# Patient Record
Sex: Male | Born: 1960
Health system: Southern US, Community
[De-identification: ages and names within clinical notes are randomized; demographics above are authoritative.]

## PROBLEM LIST (undated history)

## (undated) DIAGNOSIS — J4550 Severe persistent asthma, uncomplicated: Secondary | ICD-10-CM

## (undated) DIAGNOSIS — U071 COVID-19: Secondary | ICD-10-CM

## (undated) DIAGNOSIS — R053 Chronic cough: Secondary | ICD-10-CM

## (undated) DIAGNOSIS — J45909 Unspecified asthma, uncomplicated: Secondary | ICD-10-CM

## (undated) DIAGNOSIS — F17200 Nicotine dependence, unspecified, uncomplicated: Secondary | ICD-10-CM

## (undated) HISTORY — DX: COVID-19: U07.1

## (undated) HISTORY — DX: Nicotine dependence, unspecified, uncomplicated: F17.200

---

## 2017-10-25 ENCOUNTER — Ambulatory Visit
Admission: EM | Admit: 2017-10-25 | Discharge: 2017-10-25 | Disposition: A | Discharge status: Home / Self Care | Payer: 59 | Attending: Internal Medicine | Admitting: Internal Medicine

## 2017-10-25 DIAGNOSIS — M545 Low back pain, unspecified: Secondary | ICD-10-CM

## 2017-10-25 DIAGNOSIS — N3090 Cystitis, unspecified without hematuria: Secondary | ICD-10-CM | POA: Diagnosis not present

## 2017-10-25 LAB — URINALYSIS, COMPLETE (UACMP) WITH MICROSCOPIC
Bacteria, UA: NONE SEEN
Bilirubin Urine: NEGATIVE
Glucose, UA: NEGATIVE mg/dL
Ketones, ur: NEGATIVE mg/dL
Nitrite: NEGATIVE
Specific Gravity, Urine: 1.015 (ref 1.005–1.030)
Squamous Epithelial / LPF: NONE SEEN (ref 0–5)
pH: 7 (ref 5.0–8.0)

## 2017-10-25 LAB — CBC WITH DIFFERENTIAL/PLATELET
Basophils Absolute: 0 10*3/uL (ref 0–0.1)
Basophils Relative: 1 %
Eosinophils Absolute: 0.2 10*3/uL (ref 0–0.7)
Eosinophils Relative: 3 %
HCT: 43.5 % (ref 40.0–52.0)
HEMOGLOBIN: 15 g/dL (ref 13.0–18.0)
LYMPHS ABS: 1.4 10*3/uL (ref 1.0–3.6)
LYMPHS PCT: 19 %
MCH: 30.3 pg (ref 26.0–34.0)
MCHC: 34.5 g/dL (ref 32.0–36.0)
MCV: 88 fL (ref 80.0–100.0)
Monocytes Absolute: 0.8 10*3/uL (ref 0.2–1.0)
Monocytes Relative: 11 %
NEUTROS PCT: 68 %
Neutro Abs: 5.2 10*3/uL (ref 1.4–6.5)
Platelets: 242 10*3/uL (ref 150–440)
RBC: 4.94 MIL/uL (ref 4.40–5.90)
RDW: 13.4 % (ref 11.5–14.5)
WBC: 7.7 10*3/uL (ref 3.8–10.6)

## 2017-10-25 MED ORDER — METHOCARBAMOL 500 MG PO TABS: 500.0000 mg | ORAL_TABLET | Freq: Two times a day (BID) | ORAL | 0 refills | Status: DC

## 2017-10-25 MED ORDER — NITROFURANTOIN MONOHYD MACRO 100 MG PO CAPS: 100.0000 mg | ORAL_CAPSULE | Freq: Two times a day (BID) | ORAL | 0 refills | Status: DC

## 2017-10-25 NOTE — ED Provider Notes (Signed)
[facility]    CSN: [id number] Arrival date & time: 10/25/17  1240     History   Chief Complaint Chief Complaint  Patient presents with  . Back Pain    HPI Philip [name] is a 57 y.o. male who presents today for evaluation of right-sided low back pain ongoing since Thursday.  Patient denies any trauma or injury affecting his lower back.  The pain does not occur constantly, he is unsure of what activities precipitate the pain, the only thing that he found that helps with the pain is laying still.  He reports that sharp pain which radiates from the right lumbar region around his right flank and into his groin, denies any fevers or chills at home.  He does report associated nausea and vomiting.  He has not noticed any changes in the appearance or smell of his urine, he does report changes in the frequency, recently he states that he has had a decreased frequency.  No surgical history to the abdomen or lower back.  He is currently not taking any medications for relief.  HPI  History reviewed. No pertinent past medical history.  There are no active problems to display for this patient.   History reviewed. No pertinent surgical history.     Home Medications    Prior to Admission medications   Medication Sig Start Date End Date Taking? Authorizing Provider  methocarbamol (ROBAXIN) 500 MG tablet Take 1 tablet (500 mg total) by mouth 2 (two) times daily. 10/25/17   Anson Oregon, PA-C  nitrofurantoin, macrocrystal-monohydrate, (MACROBID) 100 MG capsule Take 1 capsule (100 mg total) by mouth 2 (two) times daily. 10/25/17   Anson Oregon, PA-C    Family History No family history on file.  Social History Social History   Tobacco Use  . Smoking status: Current Every Day Smoker  . Smokeless tobacco: Never Used  Substance Use Topics  . Alcohol use: Not on file  . Drug use: Not on file     Allergies   Patient has no known allergies.   Review of  Systems Review of Systems  Gastrointestinal: Positive for abdominal pain, nausea and vomiting.  Genitourinary: Positive for decreased urine volume and flank pain. Negative for discharge, frequency, hematuria, testicular pain and urgency.  All other systems reviewed and are negative.  Physical Exam Triage Vital Signs ED Triage Vitals  Enc Vitals Group     BP 10/25/17 1250 (!) 151/95     Pulse Rate 10/25/17 1250 86     Resp 10/25/17 1250 18     Temp 10/25/17 1250 98.7 F (37.1 C)     Temp Source 10/25/17 1250 Oral     SpO2 10/25/17 1250 99 %     Weight --      Height --      Head Circumference --      Peak Flow --      Pain Score 10/25/17 1252 3     Pain Loc --      Pain Edu? --      Excl. in GC? --    No data found.  Updated Vital Signs BP (!) 151/95 (BP Location: Left Arm)   Pulse 86   Temp 98.7 F (37.1 C) (Oral)   Resp 18   SpO2 99%   Visual Acuity Right Eye Distance:   Left Eye Distance:   Bilateral Distance:    Right Eye Near:   Left Eye Near:    Bilateral Near:  Physical Exam  Constitutional: He appears well-developed and well-nourished. He is active.  Cardiovascular: Normal rate, regular rhythm, S1 normal, S2 normal, normal heart sounds and normal pulses.  Pulmonary/Chest: Effort normal and breath sounds normal. No stridor. He has no wheezes. He has no rhonchi. He has no rales.  Abdominal: Soft. Normal appearance and bowel sounds are normal. He exhibits no distension, no pulsatile liver, no abdominal bruit and no pulsatile midline mass. There is no tenderness. There is CVA tenderness. There is no tenderness at McBurney's point and negative Murphy's sign. No hernia.  Neurological: He is alert.   UC Treatments / Results  Labs (all labs ordered are listed, but only abnormal results are displayed) Labs Reviewed  URINALYSIS, COMPLETE (UACMP) WITH MICROSCOPIC - Abnormal; Notable for the following components:      Result Value   Hgb urine dipstick LARGE (*)     Protein, ur TRACE (*)    Leukocytes, UA TRACE (*)    All other components within normal limits  CBC WITH DIFFERENTIAL/PLATELET    EKG None  Radiology No results found.  Procedures Procedures (including critical care time)  Medications Ordered in UC Medications - No data to display  Initial Impression / Assessment and Plan / UC Course  I have reviewed the triage vital signs and the nursing notes.  Pertinent labs & imaging results that were available during my care of the patient were reviewed by me and considered in my medical decision making (see chart for details).     1.  Treatment options were discussed today with the patient. 2.  UA borderline for UTI, no increased in WBC on CBC. 3.  Will treat patient for presumed UTI, also provided muscle relaxer as his pain may be muscular in nature. 4.  Increase water intake over next several days. 5.  Follow-up or contact clinic if symptoms worsen or fail to improve. Final Clinical Impressions(s) / UC Diagnoses   Final diagnoses:  Acute right-sided low back pain without sciatica  Cystitis     Discharge Instructions     -Take medications as directed. -Increase water intake. -If symptoms don't improve with medication contact PCP or [facility].   ED Prescriptions    Medication Sig Dispense Auth. Provider   methocarbamol (ROBAXIN) 500 MG tablet Take 1 tablet (500 mg total) by mouth 2 (two) times daily. 20 tablet Anson OregonMcGhee, James Lance, PA-C   nitrofurantoin, macrocrystal-monohydrate, (MACROBID) 100 MG capsule Take 1 capsule (100 mg total) by mouth 2 (two) times daily. 14 capsule Anson OregonMcGhee, James Lance, PA-C     Controlled Substance Prescriptions [place] Controlled Substance Registry consulted? Not Applicable   Anson OregonMcGhee, James Lance, PA-C 10/25/17 1440

## 2017-10-25 NOTE — ED Triage Notes (Signed)
Pt states since thur. He has been having low back pain on his right side radiating to his abdomen and groin area. Have been taking tylenol that helped ease the pain. Does make him feel nauseous with vomiting. No urinary symptoms reported.

## 2017-10-25 NOTE — Discharge Instructions (Signed)
-  Take medications as directed. -Increase water intake. -If symptoms don't improve with medication contact PCP or [facility].

## 2017-10-29 ENCOUNTER — Encounter: Payer: Self-pay | Admitting: Family Medicine

## 2017-10-29 ENCOUNTER — Ambulatory Visit (INDEPENDENT_AMBULATORY_CARE_PROVIDER_SITE_OTHER): Payer: Self-pay | Admitting: Family Medicine

## 2017-10-29 VITALS — BP 124/82 | HR 104 | Temp 98.4°F | Resp 18 | Ht 67.0 in | Wt 177.0 lb

## 2017-10-29 DIAGNOSIS — Z0000 Encounter for general adult medical examination without abnormal findings: Secondary | ICD-10-CM

## 2017-10-29 LAB — POCT URINALYSIS DIP (MANUAL ENTRY)
Bilirubin, UA: NEGATIVE
GLUCOSE UA: NEGATIVE mg/dL
Ketones, POC UA: NEGATIVE mg/dL
Leukocytes, UA: NEGATIVE
NITRITE UA: NEGATIVE
PROTEIN UA: NEGATIVE mg/dL
RBC UA: NEGATIVE
SPEC GRAV UA: 1.02 (ref 1.010–1.025)
UROBILINOGEN UA: 1 U/dL
pH, UA: 6 (ref 5.0–8.0)

## 2017-10-29 NOTE — Patient Instructions (Addendum)
Select a PCP from the list provided to establish care and obtain recommended health screenings. Continue exercise and other activities of healthy living. Great job with quitting smoking!    Health Maintenance, Male A healthy lifestyle and preventive care is important for your health and wellness. Ask your health care provider about what schedule of regular examinations is right for you. What should I know about weight and diet? Eat a Healthy Diet  Eat plenty of vegetables, fruits, whole grains, low-fat dairy products, and lean protein.  Do not eat a lot of foods high in solid fats, added sugars, or salt.  Maintain a Healthy Weight Regular exercise can help you achieve or maintain a healthy weight. You should:  Do at least 150 minutes of exercise each week. The exercise should increase your heart rate and make you sweat (moderate-intensity exercise).  Do strength-training exercises at least twice a week.  Watch Your Levels of Cholesterol and Blood Lipids  Have your blood tested for lipids and cholesterol every 5 years starting at 57 years of age. If you are at high risk for heart disease, you should start having your blood tested when you are 57 years old. You may need to have your cholesterol levels checked more often if: ? Your lipid or cholesterol levels are high. ? You are older than 57 years of age. ? You are at high risk for heart disease.  What should I know about cancer screening? Many types of cancers can be detected early and may often be prevented. Lung Cancer  You should be screened every year for lung cancer if: ? You are a current smoker who has smoked for at least 30 years. ? You are a former smoker who has quit within the past 15 years.  Talk to your health care provider about your screening options, when you should start screening, and how often you should be screened.  Colorectal Cancer  Routine colorectal cancer screening usually begins at 57 years of age  and should be repeated every 5-10 years until you are 57 years old. You may need to be screened more often if early forms of precancerous polyps or small growths are found. Your health care provider may recommend screening at an earlier age if you have risk factors for colon cancer.  Your health care provider may recommend using home test kits to check for hidden blood in the stool.  A small camera at the end of a tube can be used to examine your colon (sigmoidoscopy or colonoscopy). This checks for the earliest forms of colorectal cancer.  Prostate and Testicular Cancer  Depending on your age and overall health, your health care provider may do certain tests to screen for prostate and testicular cancer.  Talk to your health care provider about any symptoms or concerns you have about testicular or prostate cancer.  Skin Cancer  Check your skin from head to toe regularly.  Tell your health care provider about any new moles or changes in moles, especially if: ? There is a change in a mole's size, shape, or color. ? You have a mole that is larger than a pencil eraser.  Always use sunscreen. Apply sunscreen liberally and repeat throughout the day.  Protect yourself by wearing long sleeves, pants, a wide-brimmed hat, and sunglasses when outside.  What should I know about heart disease, diabetes, and high blood pressure?  If you are 82-29 years of age, have your blood pressure checked every 3-5 years. If you are 40  years of age or older, have your blood pressure checked every year. You should have your blood pressure measured twice-once when you are at a hospital or clinic, and once when you are not at a hospital or clinic. Record the average of the two measurements. To check your blood pressure when you are not at a hospital or clinic, you can use: ? An automated blood pressure machine at a pharmacy. ? A home blood pressure monitor.  Talk to your health care provider about your target blood  pressure.  If you are between 8545-57 years old, ask your health care provider if you should take aspirin to prevent heart disease.  Have regular diabetes screenings by checking your fasting blood sugar level. ? If you are at a normal weight and have a low risk for diabetes, have this test once every three years after the age of 57. ? If you are overweight and have a high risk for diabetes, consider being tested at a younger age or more often.  A one-time screening for abdominal aortic aneurysm (AAA) by ultrasound is recommended for men aged 65-75 years who are current or former smokers. What should I know about preventing infection? Hepatitis B If you have a higher risk for hepatitis B, you should be screened for this virus. Talk with your health care provider to find out if you are at risk for hepatitis B infection. Hepatitis C Blood testing is recommended for:  Everyone born from [birth year] through 1965.  Anyone with known risk factors for hepatitis C.  Sexually Transmitted Diseases (STDs)  You should be screened each year for STDs including gonorrhea and chlamydia if: ? You are sexually active and are younger than 57 years of age. ? You are older than 57 years of age and your health care provider tells you that you are at risk for this type of infection. ? Your sexual activity has changed since you were last screened and you are at an increased risk for chlamydia or gonorrhea. Ask your health care provider if you are at risk.  Talk with your health care provider about whether you are at high risk of being infected with HIV. Your health care provider may recommend a prescription medicine to help prevent HIV infection.  What else can I do?  Schedule regular health, dental, and eye exams.  Stay current with your vaccines (immunizations).  Do not use any tobacco products, such as cigarettes, chewing tobacco, and e-cigarettes. If you need help quitting, ask your health care  provider.  Limit alcohol intake to no more than 2 drinks per day. One drink equals 12 ounces of beer, 5 ounces of wine, or 1 ounces of hard liquor.  Do not use street drugs.  Do not share needles.  Ask your health care provider for help if you need support or information about quitting drugs.  Tell your health care provider if you often feel depressed.  Tell your health care provider if you have ever been abused or do not feel safe at home. This information is not intended to replace advice given to you by your health care provider. Make sure you discuss any questions you have with your health care provider. Document Released: 10/25/2007 Document Revised: 12/26/2015 Document Reviewed: 01/30/2015 Elsevier Interactive Patient Education  2018 [employer]     Coping with Quitting Smoking Quitting smoking is a physical and mental challenge. You will face cravings, withdrawal symptoms, and temptation. Before quitting, work with your health care provider to make  a plan that can help you cope. Preparation can help you quit and keep you from giving in. How can I cope with cravings? Cravings usually last for 5-10 minutes. If you get through it, the craving will pass. Consider taking the following actions to help you cope with cravings: Keep your mouth busy: Chew sugar-free gum. Suck on hard candies or a straw. Brush your teeth. Keep your hands and body busy: Immediately change to a different activity when you feel a craving. Squeeze or play with a ball. Do an activity or a hobby, like making bead jewelry, practicing needlepoint, or working with wood. Mix up your normal routine. Take a short exercise break. Go for a quick walk or run up and down stairs. Spend time in public places where smoking is not allowed. Focus on doing something kind or helpful for someone else. Call a friend or family member to talk during a craving. Join a support group. Call a quit line, such as  1-800-QUIT-NOW. Talk with your health care provider about medicines that might help you cope with cravings and make quitting easier for you.  How can I deal with withdrawal symptoms? Your body may experience negative effects as it tries to get used to not having nicotine in the system. These effects are called withdrawal symptoms. They may include: Feeling hungrier than normal. Trouble concentrating. Irritability. Trouble sleeping. Feeling depressed. Restlessness and agitation. Craving a cigarette.  To manage withdrawal symptoms: Avoid places, people, and activities that trigger your cravings. Remember why you want to quit. Get plenty of sleep. Avoid coffee and other caffeinated drinks. These may worsen some of your symptoms.  How can I handle social situations? Social situations can be difficult when you are quitting smoking, especially in the first few weeks. To manage this, you can: Avoid parties, bars, and other social situations where people might be smoking. Avoid alcohol. Leave right away if you have the urge to smoke. Explain to your family and friends that you are quitting smoking. Ask for understanding and support. Plan activities with friends or family where smoking is not an option.  What are some ways I can cope with stress? Wanting to smoke may cause stress, and stress can make you want to smoke. Find ways to manage your stress. Relaxation techniques can help. For example: Breathe slowly and deeply, in through your nose and out through your mouth. Listen to soothing, relaxing music. Talk with a family member or friend about your stress. Light a candle. Soak in a bath or take a shower. Think about a peaceful place.  What are some ways I can prevent weight gain? Be aware that many people gain weight after they quit smoking. However, not everyone does. To keep from gaining weight, have a plan in place before you quit and stick to the plan after you quit. Your plan  should include: Having healthy snacks. When you have a craving, it may help to: Eat plain popcorn, crunchy carrots, celery, or other cut vegetables. Chew sugar-free gum. Changing how you eat: Eat small portion sizes at meals. Eat 4-6 small meals throughout the day instead of 1-2 large meals a day. Be mindful when you eat. Do not watch television or do other things that might distract you as you eat. Exercising regularly: Make time to exercise each day. If you do not have time for a long workout, do short bouts of exercise for 5-10 minutes several times a day. Do some form of strengthening exercise, like weight lifting,  and some form of aerobic exercise, like running or swimming. Drinking plenty of water or other low-calorie or no-calorie drinks. Drink 6-8 glasses of water daily, or as much as instructed by your health care provider.  Summary Quitting smoking is a physical and mental challenge. You will face cravings, withdrawal symptoms, and temptation to smoke again. Preparation can help you as you go through these challenges. You can cope with cravings by keeping your mouth busy (such as by chewing gum), keeping your body and hands busy, and making calls to family, friends, or a helpline for people who want to quit smoking. You can cope with withdrawal symptoms by avoiding places where people smoke, avoiding drinks with caffeine, and getting plenty of rest. Ask your health care provider about the different ways to prevent weight gain, avoid stress, and handle social situations. This information is not intended to replace advice given to you by your health care provider. Make sure you discuss any questions you have with your health care provider. Document Released: 04/25/2016 Document Revised: 04/25/2016 Document Reviewed: 04/25/2016 Elsevier Interactive Patient Education  [employer].

## 2017-10-29 NOTE — Progress Notes (Signed)
Subjective:  Philip [name] is a 57 y.o. male who presents for wellness exam in order to satisfy employer sponsored health benefits requirements. He is a current daily smoker, stopped about 4 days ago and stopped soda. Sea salt. Over the counter inhaler use in the past. In his mid-20's he experienced routine wheezing and shortness of breath without a formal diagnosis of asthma or COPD. These symptoms resolved is his 20's and he denies any current symptoms of SOB, exertional dyspnea, chest pain, dizziness, or headaches. He endorses routine physical exercise which includes walking 1-2 times per week, intervals of 30-40 minutes.   Overdue Health Maintenance   He is currently overdue for routine screening labs, prostate evaluation with PSA testing, baseline EKG, and Hep C screening.  Denies known family history of cardiovascular disease, cancer, chronic respiratory disease, or diabetes.   Patient denies any current health related concerns.   Patient reports only a surgery to remove Ganglion Cyst from left wrist.  Social History   Tobacco Use  . Smoking status: Current Every Day Smoker  . Smokeless tobacco: Never Used  Substance Use Topics  . Alcohol use: Not on file  . Drug use: Not on file    No Known Allergies  Current Outpatient Medications  Medication Sig Dispense Refill  . methocarbamol (ROBAXIN) 500 MG tablet Take 1 tablet (500 mg total) by mouth 2 (two) times daily. 20 tablet 0  . nitrofurantoin, macrocrystal-monohydrate, (MACROBID) 100 MG capsule Take 1 capsule (100 mg total) by mouth 2 (two) times daily. 14 capsule 0   No current facility-administered medications for this visit.     Review of Systems  Constitutional: Negative.   HENT: Negative.   Eyes: Negative.   Respiratory: Negative.   Cardiovascular: Negative.   Gastrointestinal: Negative.   Genitourinary: Negative.   Musculoskeletal: Positive for back pain.       Recently tx for sciatica   Skin: Negative.    Neurological: Negative.   Endo/Heme/Allergies: Negative.   Psychiatric/Behavioral: Negative for depression, hallucinations, substance abuse and suicidal ideas. The patient is not nervous/anxious and does not have insomnia.    Objective:  Blood pressure 124/82, pulse (!) 104, temperature 98.4 F (36.9 C), temperature source Oral, resp. rate 18, height 5\' 7"  (1.702 m), weight 177 lb (80.3 kg), SpO2 97 %.  Constitutional: Patient appears well-developed and well-nourished. No distress. HENT: Normocephalic, atraumatic, External right and left ear normal. Oropharynx is clear and moist (upper and lower dentures).  Eyes: Conjunctivae and EOM are normal. PERRLA, no scleral icterus. Neck: Normal ROM. Neck supple. No JVD. No tracheal deviation. No thyromegaly. CVS: RRR, S1/S2 +, no murmurs, no gallops, no carotid bruit.  Pulmonary: Effort and breath sounds normal, no stridor, rhonchi, wheezes, rales.  Abdominal: Soft. BS +, no distension, tenderness, rebound or guarding.  Musculoskeletal: Normal range of motion. No edema and no tenderness.  Lymphadenopathy: No lymphadenopathy noted, cervical Neuro: Alert. Normal reflexes, muscle tone coordination. No cranial nerve deficit. Skin: Skin is warm and dry. No rash noted. Not diaphoretic. No erythema. No pallor. Psychiatric: Normal mood and affect. Behavior, judgment, thought content normal.    Hearing Screening   125Hz  250Hz  500Hz  1000Hz  2000Hz  3000Hz  4000Hz  6000Hz  8000Hz   Right ear:   Pass Pass Pass  Pass    Left ear:   Pass Pass Pass  Pass      Visual Acuity Screening   Right eye Left eye Both eyes  Without correction: 20/13 20/13 20/10   With correction:  Assessment and Plan:   1. Wellness examination Discussed overdue health maintenance and the importance of establishing with a PCP. Patient verbalized understanding. Continue routine physical activity. Continue low-sodium diet.  UA negative of any abnormal findings today. Patient education  provided.   Patient will follow up with PCP to have a complete   Godfrey PickKimberly S. Tiburcio PeaHarris, MSN, FNP-C [facility]  [address]  [place], [place] [id number] [phone]

## 2018-02-24 ENCOUNTER — Ambulatory Visit: Payer: 59 | Admitting: Family Medicine

## 2018-02-24 ENCOUNTER — Encounter: Payer: Self-pay | Admitting: Family Medicine

## 2018-02-24 VITALS — BP 122/76 | HR 87 | Temp 98.5°F | Ht 67.5 in | Wt 178.6 lb

## 2018-02-24 DIAGNOSIS — Z1159 Encounter for screening for other viral diseases: Secondary | ICD-10-CM | POA: Diagnosis not present

## 2018-02-24 DIAGNOSIS — R109 Unspecified abdominal pain: Secondary | ICD-10-CM

## 2018-02-24 DIAGNOSIS — Z114 Encounter for screening for human immunodeficiency virus [HIV]: Secondary | ICD-10-CM | POA: Diagnosis not present

## 2018-02-24 DIAGNOSIS — Z0000 Encounter for general adult medical examination without abnormal findings: Secondary | ICD-10-CM

## 2018-02-24 DIAGNOSIS — Z0001 Encounter for general adult medical examination with abnormal findings: Secondary | ICD-10-CM

## 2018-02-24 DIAGNOSIS — Z716 Tobacco abuse counseling: Secondary | ICD-10-CM

## 2018-02-24 DIAGNOSIS — F17210 Nicotine dependence, cigarettes, uncomplicated: Secondary | ICD-10-CM

## 2018-02-24 DIAGNOSIS — Z125 Encounter for screening for malignant neoplasm of prostate: Secondary | ICD-10-CM

## 2018-02-24 LAB — POCT URINALYSIS DIPSTICK
BILIRUBIN UA: NEGATIVE
Blood, UA: NEGATIVE
GLUCOSE UA: NEGATIVE
KETONES UA: NEGATIVE
Leukocytes, UA: NEGATIVE
Nitrite, UA: NEGATIVE
Protein, UA: NEGATIVE
Spec Grav, UA: 1.025 (ref 1.010–1.025)
Urobilinogen, UA: 1 E.U./dL
pH, UA: 6 (ref 5.0–8.0)

## 2018-02-24 LAB — CBC WITH DIFFERENTIAL/PLATELET
BASOS PCT: 1.2 % (ref 0.0–3.0)
Basophils Absolute: 0 10*3/uL (ref 0.0–0.1)
EOS PCT: 0.8 % (ref 0.0–5.0)
Eosinophils Absolute: 0 10*3/uL (ref 0.0–0.7)
HCT: 43.7 % (ref 39.0–52.0)
HEMOGLOBIN: 14.6 g/dL (ref 13.0–17.0)
Lymphocytes Relative: 38.1 % (ref 12.0–46.0)
Lymphs Abs: 1.4 10*3/uL (ref 0.7–4.0)
MCHC: 33.4 g/dL (ref 30.0–36.0)
MCV: 90.7 fl (ref 78.0–100.0)
Monocytes Absolute: 0.5 10*3/uL (ref 0.1–1.0)
Monocytes Relative: 15.1 % — ABNORMAL HIGH (ref 3.0–12.0)
Neutro Abs: 1.6 10*3/uL (ref 1.4–7.7)
Neutrophils Relative %: 44.8 % (ref 43.0–77.0)
Platelets: 227 10*3/uL (ref 150.0–400.0)
RBC: 4.82 Mil/uL (ref 4.22–5.81)
RDW: 14 % (ref 11.5–15.5)
WBC: 3.6 10*3/uL — AB (ref 4.0–10.5)

## 2018-02-24 LAB — LIPID PANEL
Cholesterol: 186 mg/dL (ref 0–200)
HDL: 50.9 mg/dL (ref >39.00)
LDL Cholesterol: 110 mg/dL — ABNORMAL HIGH (ref 0–99)
NONHDL: 134.6
Total CHOL/HDL Ratio: 4
Triglycerides: 123 mg/dL (ref 0.0–149.0)
VLDL: 24.6 mg/dL (ref 0.0–40.0)

## 2018-02-24 LAB — HEPATIC FUNCTION PANEL
ALT: 25 U/L (ref 0–53)
AST: 16 U/L (ref 0–37)
Albumin: 4.2 g/dL (ref 3.5–5.2)
Alkaline Phosphatase: 67 U/L (ref 39–117)
Bilirubin, Direct: 0.1 mg/dL (ref 0.0–0.3)
Total Bilirubin: 0.4 mg/dL (ref 0.2–1.2)
Total Protein: 7.1 g/dL (ref 6.0–8.3)

## 2018-02-24 LAB — BASIC METABOLIC PANEL
BUN: 13 mg/dL (ref 6–23)
CHLORIDE: 103 meq/L (ref 96–112)
CO2: 33 mEq/L — ABNORMAL HIGH (ref 19–32)
Calcium: 9.9 mg/dL (ref 8.4–10.5)
Creatinine, Ser: 0.95 mg/dL (ref 0.40–1.50)
GFR: 104.9 mL/min (ref >60.00)
GLUCOSE: 87 mg/dL (ref 70–99)
POTASSIUM: 3.9 meq/L (ref 3.5–5.1)
Sodium: 141 mEq/L (ref 135–145)

## 2018-02-24 LAB — TSH: TSH: 1.13 u[IU]/mL (ref 0.35–4.50)

## 2018-02-24 NOTE — Patient Instructions (Signed)
Great to meet you!  Let me know if you would like to do colonoscopy!

## 2018-02-24 NOTE — Progress Notes (Signed)
Subjective:    Patient ID: Philip [name], male    DOB: 12/07/1960, 57 y.o.   MRN: [id number]  HPI  Presents to clinic to establish care with PCP.  He currently takes no medications and has no active medical issues.  He is a cigarette smoker, but smokes 3 to 4 cigarettes a day.  He had quit in past for a period of 3 to 4 months, but recently started smoking small amount again.  Patient does not know much about his family history other than his mother died at a young age, he does not know reason for her death.  Patient has never had a surgery.  Patient works for [employer] in environmental services.   Patient's Tdap vaccine and influenza vaccine up-to-date.  Patient has never had a colonoscopy.  Patient sees dentist 1-2 times per year.  Unsure of last eye exam.  Patient tries to eat a healthy diet with various proteins, vegetables and fruits.  Tries to workout regularly, enjoys using the treadmill and elliptical.  Patient also complains of some right-sided flank pain off and on for the past week.  States he has no pain now, states it comes and goes at random times.  Denies any issues with urination or having bowel movements.  Appetite has been normal.  Denies any nausea, vomiting or diarrhea.  Past Medical History:  Diagnosis Date  . Smoker    Social History   Tobacco Use  . Smoking status: Current Every Day Smoker  . Smokeless tobacco: Never Used  Substance Use Topics  . Alcohol use: Never    Frequency: Never   Family History  Problem Relation Age of Onset  . Early death Mother    History reviewed. No pertinent surgical history.  Review of Systems  Constitutional: Negative for chills, fatigue and fever.  HENT: Negative for congestion, ear pain, sinus pain and sore throat.   Eyes: Negative.   Respiratory: Negative for cough, shortness of breath and wheezing.   Cardiovascular: Negative for chest pain, palpitations and leg swelling.  Gastrointestinal: Negative for  abdominal pain, diarrhea, nausea and vomiting. +right flank pain.  Genitourinary: Negative for dysuria, frequency and urgency.  Musculoskeletal: Negative for arthralgias and myalgias.  Skin: Negative for color change, pallor and rash.  Neurological: Negative for syncope, light-headedness and headaches.  Psychiatric/Behavioral: The patient is not nervous/anxious.       Objective:   Physical Exam  Constitutional: He is oriented to person, place, and time. He appears well-developed and well-nourished. No distress.  HENT:  Head: Normocephalic and atraumatic.  Right Ear: External ear normal.  Left Ear: External ear normal.  Nose: Nose normal.  Mouth/Throat: Oropharynx is clear and moist. No oropharyngeal exudate.  Eyes: Pupils are equal, round, and reactive to light. Conjunctivae and EOM are normal. Right eye exhibits no discharge. Left eye exhibits no discharge. No scleral icterus.  Neck: Normal range of motion. Neck supple. No JVD present. No tracheal deviation present. No thyromegaly present.  Cardiovascular: Normal rate, regular rhythm and normal heart sounds.  Pulmonary/Chest: Effort normal and breath sounds normal. No respiratory distress. He has no wheezes. He has no rales.  Abdominal: Soft. Bowel sounds are normal. He exhibits no distension and no mass. There is no tenderness. There is no rebound and no guarding. No hernia.  No CVA tenderness on exam.   Genitourinary:  Genitourinary Comments: GU exam declined by patient in clinic today.   Musculoskeletal: Normal range of motion. He exhibits no edema, tenderness or  deformity.  Neurological: He is alert and oriented to person, place, and time. No cranial nerve deficit. Coordination normal.  Skin: Skin is warm and dry. Capillary refill takes less than 2 seconds. No rash noted. No erythema. No pallor.  Psychiatric: He has a normal mood and affect. His behavior is normal. Judgment and thought content normal.  Nursing note and vitals  reviewed.     Vitals:   02/24/18 0902  BP: 122/76  Pulse: 87  Temp: 98.5 F (36.9 C)  SpO2: 96%   Assessment & Plan:    Well adult exam - lab work ordered today including HIV screening, hepatitis C screening, CBC, BMP, hepatic panel, TSH, lipid panel and PSA for prostate cancer screening.  Long discussion with patient about different options for colon cancer screenings including colonoscopy, Cologuard and stool cards.  Patient declines doing any colon cancer screenings at this time, states he will let me know if he changes his mind.  Patient states he would feel most comfortable doing colonoscopy, would prefer not to have to do Cologuard or stool cards.  Patient aware he can call and let us know when he is ready for colonoscopy referral to be made.  Discussed healthy diet and regular exercise.  Complete smoking cessation encouraged, discussed option of using nicotine gum to help wean himself off of the last 3 to 4 cigarettes/day he is currently smoking.  Right flank pain - urinalysis is clear.  Urine also sent to lab for culture.  Lab work will look at electrolyte levels, liver and kidney functions.  If pain continues to persist we discussed options of doing a CT scan of abdomen and pelvis. Advised to increase water intake, avoid excess caffeine/sugary beverages.   Lab work results will help lead [place] in the next step in the plan of care.  Patient advised he can return to clinic at any time if issues arise.

## 2018-02-25 LAB — URINE CULTURE
MICRO NUMBER:: 91243992
SPECIMEN QUALITY: ADEQUATE

## 2018-02-25 LAB — HIV ANTIBODY (ROUTINE TESTING W REFLEX): HIV 1&2 Ab, 4th Generation: NONREACTIVE

## 2018-02-25 LAB — HEPATITIS C ANTIBODY
HEP C AB: NONREACTIVE
SIGNAL TO CUT-OFF: 0.19 (ref <1.00)

## 2018-02-25 LAB — PSA, TOTAL AND FREE
PSA, % FREE: 25 % — AB (ref >25)
PSA, Free: 0.2 ng/mL
PSA, TOTAL: 0.8 ng/mL (ref <=4.0)

## 2018-03-28 ENCOUNTER — Other Ambulatory Visit: Payer: Self-pay

## 2018-03-28 ENCOUNTER — Ambulatory Visit
Admission: EM | Admit: 2018-03-28 | Discharge: 2018-03-28 | Disposition: A | Discharge status: Home / Self Care | Payer: 59 | Attending: Emergency Medicine | Admitting: Emergency Medicine

## 2018-03-28 DIAGNOSIS — M545 Low back pain, unspecified: Secondary | ICD-10-CM

## 2018-03-28 DIAGNOSIS — R109 Unspecified abdominal pain: Secondary | ICD-10-CM

## 2018-03-28 LAB — URINALYSIS, COMPLETE (UACMP) WITH MICROSCOPIC
BILIRUBIN URINE: NEGATIVE
Glucose, UA: NEGATIVE mg/dL
HGB URINE DIPSTICK: NEGATIVE
Ketones, ur: NEGATIVE mg/dL
LEUKOCYTES UA: NEGATIVE
NITRITE: NEGATIVE
PH: 6.5 (ref 5.0–8.0)
Protein, ur: NEGATIVE mg/dL
RBC / HPF: NONE SEEN RBC/hpf (ref 0–5)
SPECIFIC GRAVITY, URINE: 1.015 (ref 1.005–1.030)
Squamous Epithelial / LPF: NONE SEEN (ref 0–5)

## 2018-03-28 MED ORDER — IBUPROFEN 600 MG PO TABS: 600.0000 mg | ORAL_TABLET | Freq: Four times a day (QID) | ORAL | 0 refills | Status: DC | PRN

## 2018-03-28 MED ORDER — ONDANSETRON 8 MG PO TBDP: 8.0000 mg | ORAL_TABLET | Freq: Three times a day (TID) | ORAL | 0 refills | Status: DC | PRN

## 2018-03-28 NOTE — ED Triage Notes (Signed)
Pt with 2 days of left flank pain. Concerned for kidney stones

## 2018-03-28 NOTE — ED Provider Notes (Signed)
HPI  SUBJECTIVE:  Philip [name] is a 57 y.o. male who presents with 3 days of intermittent left throbbing back pain that radiates down his left flank into his groin.  States that it lasts up to 20 minutes.  This started off as a diffuse midline abdominal pain but now has localized to his back/flank.  He reports dysuria starting today.  States that his urine smells stronger than usual.  Reports nausea, no vomiting.  No urinary urgency, cloudy or odorous urine, frequency, hematuria.  No fevers.  He had a normal bowel movement this morning.  No abdominal distention, anorexia.  No testicular pain, penile rash, discharge.  No recent lifting, change in his physical activity, trauma to the back.  No antipyretic in the past 4 to 6 hours.  He tried leftover methocarbamol with improvement of symptoms.  No aggravating factors.  The pain is not associated with drinking, eating, movement, defecation, urination.  He states that the car ride over here was not painful.  He states that this feels similar to previous kidney infections for which he was seen here in June.  Review shows that he was seen here, thought to have a UTI, treated with Macrobid for 7 days.  There was no mention of pyelonephritis.  Past medical history negative for nephrolithiasis, diabetes, hypertension, abdominal surgeries, back injury, back pain.  PMD: Tracey Harries, FNP     Past Medical History:  Diagnosis Date  . Smoker     History reviewed. No pertinent surgical history.  Family History  Problem Relation Age of Onset  . Early death Mother     Social History   Tobacco Use  . Smoking status: Current Every Day Smoker    Packs/day: 0.25    Types: Cigarettes  . Smokeless tobacco: Never Used  Substance Use Topics  . Alcohol use: Never    Frequency: Never  . Drug use: Never    No current facility-administered medications for this encounter.   Current Outpatient Medications:  .  ibuprofen (ADVIL,MOTRIN) 600 MG tablet, Take 1  tablet (600 mg total) by mouth every 6 (six) hours as needed., Disp: 30 tablet, Rfl: 0 .  ondansetron (ZOFRAN-ODT) 8 MG disintegrating tablet, Take 1 tablet (8 mg total) by mouth every 8 (eight) hours as needed for nausea or vomiting., Disp: 20 tablet, Rfl: 0  No Known Allergies   ROS  As noted in HPI.   Physical Exam  BP 136/90 (BP Location: Left Arm)   Pulse 78   Temp 98.4 F (36.9 C) (Oral)   Resp 18   Ht 5\' 7"  (1.702 m)   Wt 83.5 kg   SpO2 100%   BMI 28.82 kg/m   Constitutional: Well developed, well nourished, no acute distress Eyes:  EOMI, conjunctiva normal bilaterally HENT: Normocephalic, atraumatic,mucus membranes moist Respiratory: Normal inspiratory effort Cardiovascular: Normal rate GI: Normal appearance, soft.  Mild bilateral flank tenderness.  Negative Murphy, negative McBurney.  Normal bowel sounds.  No guarding, rebound.  No pusitile masses. back: Positive left paralumbar tenderness.  No appreciable muscle spasm.  No L-spine tenderness.  No CVA tenderness bilaterally. skin: No rash, skin intact Musculoskeletal: no deformities Neurologic: Alert & oriented x 3, no focal neuro deficits Psychiatric: Speech and behavior appropriate   ED Course   Medications - No data to display  Orders Placed This Encounter  Procedures  . Urine culture    Standing Status:   Standing    Number of Occurrences:   1  Order Specific Question:   Patient immune status    Answer:   Normal  . Urinalysis, Complete w Microscopic    Standing Status:   Standing    Number of Occurrences:   1  . Strain all urine    Standing Status:   Standing    Number of Occurrences:   1    Results for orders placed or performed during the hospital encounter of 03/28/18 (from the past 24 hour(s))  Urinalysis, Complete w Microscopic     Status: Abnormal   Collection Time: 03/28/18 12:34 PM  Result Value Ref Range   Color, Urine YELLOW YELLOW   APPearance CLEAR CLEAR   Specific Gravity, Urine  1.015 1.005 - 1.030   pH 6.5 5.0 - 8.0   Glucose, UA NEGATIVE NEGATIVE mg/dL   Hgb urine dipstick NEGATIVE NEGATIVE   Bilirubin Urine NEGATIVE NEGATIVE   Ketones, ur NEGATIVE NEGATIVE mg/dL   Protein, ur NEGATIVE NEGATIVE mg/dL   Nitrite NEGATIVE NEGATIVE   Leukocytes, UA NEGATIVE NEGATIVE   Squamous Epithelial / LPF NONE SEEN 0 - 5   WBC, UA 0-5 0 - 5 WBC/hpf   RBC / HPF NONE SEEN 0 - 5 RBC/hpf   Bacteria, UA RARE (A) NONE SEEN   Mucus PRESENT    No results found.  ED Clinical Impression  Left flank pain  Acute left-sided low back pain without sciatica   ED Assessment/Plan  UA with rare bacteria and some mucus.  No UTI.  Will send urine off for culture to confirm absence of UTI, will not start on antibiotics until culture is resulted.  This could be renal colic given the flank tenderness, he has no evidence of a surgical abdomen.    Doubt aortic abdominal aneurysm.  He also has some left paralumbar tenderness.  Discussed doing a KUB or getting a noncontrast CT today to evaluate for kidney stones, but patient has opted to try treating this first and see how he does.  We will also send home with Zofran for the nausea.  If he does not get better or if he gets worse, he will go to the ER for a complete evaluation.  Will send home with ibuprofen 600 mg to take with 1 g of Tylenol together 3 or 4 times a day, send with a urine strainer, and have him follow-up with his PMD as needed.  He will go immediately to the ER if he gets worse, fevers above 100.4, or for any concerns.  Discussed labs,  MDM, treatment plan, and plan for follow-up with patient. Discussed sn/sx that should prompt return to the ED. patient agrees with plan.   Meds ordered this encounter  Medications  . ibuprofen (ADVIL,MOTRIN) 600 MG tablet    Sig: Take 1 tablet (600 mg total) by mouth every 6 (six) hours as needed.    Dispense:  30 tablet    Refill:  0  . ondansetron (ZOFRAN-ODT) 8 MG disintegrating tablet    Sig:  Take 1 tablet (8 mg total) by mouth every 8 (eight) hours as needed for nausea or vomiting.    Dispense:  20 tablet    Refill:  0    *This clinic note was created using Scientist, clinical (histocompatibility and immunogenetics)Dragon dictation software. Therefore, there may be occasional mistakes despite careful proofreading.   ?   Domenick GongMortenson, Hassell Patras, MD 03/28/18 1724

## 2018-03-28 NOTE — Discharge Instructions (Addendum)
600 mg of ibuprofen combined with 1 g of Tylenol 4 times a day as needed for pain.  Push fluids, strain your urine.  We will contact you if your urine culture comes back positive for a urinary tract infection and we will start you on the appropriate antibiotics at that time.  Go immediately to the emergency department for fevers above 100.4, pain not controlled with Tylenol/ibuprofen, vomiting not controlled with Zofran, back swelling, severe abdominal pain, or for any other concerns.

## 2018-03-30 LAB — URINE CULTURE
Culture: NO GROWTH
Special Requests: NORMAL

## 2018-04-13 ENCOUNTER — Ambulatory Visit: Payer: Self-pay

## 2018-04-13 NOTE — Telephone Encounter (Signed)
Pt. Reports he started having diarrhea last week.Pain is a 6-7 at it's worse. Stools are mainly watery. No vomiting. Feel like he has "knots in his stomach". Encouraged to stay hydrated. Clear liquids and will try gatorade.Will try toast, white rice, potatoes. Pt. Has an appointment for tomorrow.  Answer Assessment - Initial Assessment Questions 1. DIARRHEA SEVERITY: "How bad is the diarrhea?" "How many extra stools have you had in the past 24 hours than normal?"    - NO DIARRHEA (SCALE 0)   - MILD (SCALE 1-3): Few loose or mushy BMs; increase of 1-3 stools over normal daily number of stools; mild increase in ostomy output.   -  MODERATE (SCALE 4-7): Increase of 4-6 stools daily over normal; moderate increase in ostomy output. * SEVERE (SCALE 8-10; OR 'WORST POSSIBLE'): Increase of 7 or more stools daily over normal; moderate increase in ostomy output; incontinence.     6-7 2. ONSET: "When did the diarrhea begin?"      Started last week 3. BM CONSISTENCY: "How loose or watery is the diarrhea?"      Loose 4. VOMITING: "Are you also vomiting?" If so, ask: "How many times in the past 24 hours?"      No 5. ABDOMINAL PAIN: "Are you having any abdominal pain?" If yes: "What does it feel like?" (e.g., crampy, dull, intermittent, constant)      Feels like knots 6. ABDOMINAL PAIN SEVERITY: If present, ask: "How bad is the pain?"  (e.g., Scale 1-10; mild, moderate, or severe)   - MILD (1-3): doesn't interfere with normal activities, abdomen soft and not tender to touch    - MODERATE (4-7): interferes with normal activities or awakens from sleep, tender to touch    - SEVERE (8-10): excruciating pain, doubled over, unable to do any normal activities       7 7. ORAL INTAKE: If vomiting, "Have you been able to drink liquids?" "How much fluids have you had in the past 24 hours?"     Yes 8. HYDRATION: "Any signs of dehydration?" (e.g., dry mouth [not just dry lips], too weak to stand, dizziness, new weight  loss) "When did you last urinate?"     Not passing as much urine, some dizziness 9. EXPOSURE: "Have you traveled to a foreign country recently?" "Have you been exposed to anyone with diarrhea?" "Could you have eaten any food that was spoiled?"     No 10. ANTIBIOTIC USE: "Are you taking antibiotics now or have you taken antibiotics in the past 2 months?"       No 11. OTHER SYMPTOMS: "Do you have any other symptoms?" (e.g., fever, blood in stool)       No 12. PREGNANCY: "Is there any chance you are pregnant?" "When was your last menstrual period?"       n/a  Protocols used: DIARRHEA-A-AH

## 2018-04-14 ENCOUNTER — Encounter: Payer: Self-pay | Admitting: Family Medicine

## 2018-04-14 ENCOUNTER — Ambulatory Visit (INDEPENDENT_AMBULATORY_CARE_PROVIDER_SITE_OTHER): Payer: 59 | Admitting: Family Medicine

## 2018-04-14 VITALS — BP 122/78 | HR 86 | Temp 98.3°F | Ht 67.0 in | Wt 170.0 lb

## 2018-04-14 DIAGNOSIS — R12 Heartburn: Secondary | ICD-10-CM

## 2018-04-14 DIAGNOSIS — R1010 Upper abdominal pain, unspecified: Secondary | ICD-10-CM

## 2018-04-14 DIAGNOSIS — R748 Abnormal levels of other serum enzymes: Secondary | ICD-10-CM

## 2018-04-14 DIAGNOSIS — R197 Diarrhea, unspecified: Secondary | ICD-10-CM

## 2018-04-14 DIAGNOSIS — D649 Anemia, unspecified: Secondary | ICD-10-CM | POA: Diagnosis not present

## 2018-04-14 DIAGNOSIS — R110 Nausea: Secondary | ICD-10-CM

## 2018-04-14 LAB — COMPREHENSIVE METABOLIC PANEL
ALBUMIN: 3.6 g/dL (ref 3.5–5.2)
ALK PHOS: 52 U/L (ref 39–117)
ALT: 72 U/L — AB (ref 0–53)
AST: 40 U/L — ABNORMAL HIGH (ref 0–37)
BUN: 14 mg/dL (ref 6–23)
CO2: 28 mEq/L (ref 19–32)
Calcium: 9.1 mg/dL (ref 8.4–10.5)
Chloride: 99 mEq/L (ref 96–112)
Creatinine, Ser: 0.98 mg/dL (ref 0.40–1.50)
GFR: 101.15 mL/min (ref >60.00)
Glucose, Bld: 84 mg/dL (ref 70–99)
POTASSIUM: 4 meq/L (ref 3.5–5.1)
Sodium: 133 mEq/L — ABNORMAL LOW (ref 135–145)
TOTAL PROTEIN: 6.4 g/dL (ref 6.0–8.3)
Total Bilirubin: 0.3 mg/dL (ref 0.2–1.2)

## 2018-04-14 LAB — CBC WITH DIFFERENTIAL/PLATELET
BASOS ABS: 0 10*3/uL (ref 0.0–0.1)
Basophils Relative: 0.5 % (ref 0.0–3.0)
EOS PCT: 0.4 % (ref 0.0–5.0)
Eosinophils Absolute: 0 10*3/uL (ref 0.0–0.7)
HCT: 32.9 % — ABNORMAL LOW (ref 39.0–52.0)
HEMOGLOBIN: 11.2 g/dL — AB (ref 13.0–17.0)
LYMPHS PCT: 28.4 % (ref 12.0–46.0)
Lymphs Abs: 1.7 10*3/uL (ref 0.7–4.0)
MCHC: 34.2 g/dL (ref 30.0–36.0)
MCV: 88.2 fl (ref 78.0–100.0)
Monocytes Absolute: 0.9 10*3/uL (ref 0.1–1.0)
Monocytes Relative: 15.7 % — ABNORMAL HIGH (ref 3.0–12.0)
Neutro Abs: 3.2 10*3/uL (ref 1.4–7.7)
Neutrophils Relative %: 55 % (ref 43.0–77.0)
Platelets: 345 10*3/uL (ref 150.0–400.0)
RBC: 3.72 Mil/uL — AB (ref 4.22–5.81)
RDW: 13 % (ref 11.5–15.5)
WBC: 5.8 10*3/uL (ref 4.0–10.5)

## 2018-04-14 LAB — LIPASE: Lipase: 62 U/L — ABNORMAL HIGH (ref 11.0–59.0)

## 2018-04-14 LAB — AMYLASE: AMYLASE: 78 U/L (ref 27–131)

## 2018-04-14 MED ORDER — OMEPRAZOLE 20 MG PO CPDR: 20.0000 mg | DELAYED_RELEASE_CAPSULE | Freq: Every day | ORAL | 3 refills | Status: DC

## 2018-04-14 NOTE — Progress Notes (Signed)
Subjective:    Patient ID: Philip [name], male    DOB: [birth year], 57 y.o.   MRN: [id number]  HPI  Presents to clinic c/o stomach cramps, heartburn, nausea and diarrhea off and on for 3 weeks.  Patient states he was in the ER about 3 weeks ago due to flank pain, thought maybe had kidney stone.  Patient did not have a CT scan done at that time due to declining imaging being ordered.  Patient was advised to use Tylenol or Motrin as needed for pain, use Zofran for nausea and urinary tract infection was ruled out with urine testing.  Provider in ER told him to return to PCP for follow-up.  Patient states last week he ate a very bland diet and drink mainly clear liquids.  Patient believes doing this did help his symptoms improved somewhat.  Patient states the nausea and diarrhea seems somewhat better today.  Still has a cramping and acid reflux type feeling in upper abdomen.  Patient Active Problem List   Diagnosis Date Noted  . Cigarette smoker 02/24/2018   Social History   Tobacco Use  . Smoking status: Current Every Day Smoker    Packs/day: 0.25    Types: Cigarettes  . Smokeless tobacco: Never Used  Substance Use Topics  . Alcohol use: Never    Frequency: Never    Review of Systems  Constitutional: Negative for chills, fatigue and fever.  HENT: Negative for congestion, ear pain, sinus pain and sore throat.   Eyes: Negative.   Respiratory: Negative for cough, shortness of breath and wheezing.   Cardiovascular: Negative for chest pain, palpitations and leg swelling.  Gastrointestinal: +ABD cramps, nausea, diarrhea. No vomiting.  Genitourinary: Negative for dysuria, frequency and urgency.  Musculoskeletal: Negative for arthralgias and myalgias.  Skin: Negative for color change, pallor and rash.  Neurological: Negative for syncope, light-headedness and headaches.  Psychiatric/Behavioral: The patient is not nervous/anxious.       Objective:   Physical Exam   Constitutional: He  is oriented to person, place, and time. He appears well-developed and well-nourished. No distress.  HENT:  Head: Normocephalic and atraumatic.  Eyes: Pupils are equal, round, and reactive to light. Conjunctivae and EOM are normal. No scleral icterus.  Neck: Normal range of motion. Neck supple. No tracheal deviation present.  Cardiovascular: Normal rate, regular rhythm and normal heart sounds.  Pulmonary/Chest: Effort normal and breath sounds normal. No respiratory distress. He has no wheezes. He has no rales.  Abdominal: Soft. Bowel sounds are normal. +epigastric, RUQ and LUQ abdominal pain.  Neurological: He is alert and oriented to person, place, and time.  Gait normal  Skin: Skin is warm and dry. He is not diaphoretic. No pallor.  Psychiatric: He has a normal mood and affect. His behavior is normal. Thought content normal.  Nursing note and vitals reviewed.     Vitals:   04/14/18 0911  BP: 122/78  Pulse: 86  Temp: 98.3 F (36.8 C)  SpO2: 91%    Assessment & Plan:    Upper abdominal pain/nausea/diarrhea- we will get blood work in clinic today including CBC, CMP, amylase and lipase to further investigate upper abdominal pain.  Patient also will begin omeprazole 20 mg once daily to 2 sensation of heartburn and nausea.  Patient advised he may use the Zofran he was prescribed via ER as needed for any breakthrough nausea and also suggested he can take Imodium over-the-counter if needed for diarrhea episodes.  Patient advised to eat bland foods  over the next few days and then slowly advance diet as tolerated.  Advised to keep self well-hydrated with water, Gatorade.  Once we have results of lab work and scan we will further be able to know the next step in our plan of care.  Patient advised that if pain becomes increasingly worse/very severe he is to go to emergency room right away.

## 2018-04-15 NOTE — Addendum Note (Signed)
Addended by: Leanora CoverGUSE, Keerthi Hazell on: 04/15/2018 04:53 PM   Modules accepted: Orders

## 2018-04-16 ENCOUNTER — Telehealth: Payer: Self-pay | Admitting: Family Medicine

## 2018-04-16 DIAGNOSIS — N200 Calculus of kidney: Secondary | ICD-10-CM | POA: Diagnosis not present

## 2018-04-16 DIAGNOSIS — R509 Fever, unspecified: Secondary | ICD-10-CM | POA: Diagnosis not present

## 2018-04-16 DIAGNOSIS — R1030 Lower abdominal pain, unspecified: Secondary | ICD-10-CM | POA: Diagnosis not present

## 2018-04-16 DIAGNOSIS — N132 Hydronephrosis with renal and ureteral calculous obstruction: Secondary | ICD-10-CM | POA: Diagnosis not present

## 2018-04-16 DIAGNOSIS — R109 Unspecified abdominal pain: Secondary | ICD-10-CM | POA: Diagnosis not present

## 2018-04-16 DIAGNOSIS — N50812 Left testicular pain: Secondary | ICD-10-CM | POA: Diagnosis not present

## 2018-04-16 NOTE — Telephone Encounter (Signed)
If he is in 10/10 pain that is alarming and sound like he should go to ER for evaluation. His labs did show some anemia, some elevated liver functions and our plan was to repeat next week for follow up  However if his pain has worsened to this level, he should not wait and should go to ER to make sure something emergent is not happening

## 2018-04-16 NOTE — Telephone Encounter (Signed)
Patient stated he would have someone drive him to the ED.

## 2018-04-16 NOTE — Telephone Encounter (Signed)
Patient has been taking the omeprazole since the OV with PCP, patient still having complaint of abdominal pain rated at 10 on scale of 0-10, he says th Omeprazole is just no helping.

## 2018-04-19 ENCOUNTER — Telehealth: Payer: Self-pay | Admitting: Family Medicine

## 2018-04-19 DIAGNOSIS — N132 Hydronephrosis with renal and ureteral calculous obstruction: Secondary | ICD-10-CM | POA: Diagnosis not present

## 2018-04-19 DIAGNOSIS — R110 Nausea: Secondary | ICD-10-CM | POA: Diagnosis not present

## 2018-04-19 DIAGNOSIS — N200 Calculus of kidney: Secondary | ICD-10-CM | POA: Diagnosis not present

## 2018-04-19 DIAGNOSIS — R1032 Left lower quadrant pain: Secondary | ICD-10-CM | POA: Diagnosis not present

## 2018-04-19 NOTE — Telephone Encounter (Signed)
Pt called for lab result. Labs still pending. Pt advised to call back tomorrow via agent.

## 2018-04-20 ENCOUNTER — Ambulatory Visit: Payer: 59

## 2018-04-22 ENCOUNTER — Other Ambulatory Visit: Payer: 59

## 2018-06-01 DIAGNOSIS — N200 Calculus of kidney: Secondary | ICD-10-CM | POA: Diagnosis not present

## 2018-06-14 DIAGNOSIS — N200 Calculus of kidney: Secondary | ICD-10-CM | POA: Diagnosis not present

## 2018-06-14 DIAGNOSIS — R109 Unspecified abdominal pain: Secondary | ICD-10-CM | POA: Diagnosis not present

## 2019-10-05 ENCOUNTER — Ambulatory Visit (INDEPENDENT_AMBULATORY_CARE_PROVIDER_SITE_OTHER): Payer: BC Managed Care – PPO

## 2019-10-05 ENCOUNTER — Other Ambulatory Visit: Payer: Self-pay

## 2019-10-05 ENCOUNTER — Ambulatory Visit (INDEPENDENT_AMBULATORY_CARE_PROVIDER_SITE_OTHER): Payer: BC Managed Care – PPO | Admitting: Nurse Practitioner

## 2019-10-05 ENCOUNTER — Encounter: Payer: Self-pay | Admitting: Nurse Practitioner

## 2019-10-05 VITALS — BP 148/90 | HR 82 | Temp 97.8°F | Ht 67.0 in | Wt 190.4 lb

## 2019-10-05 DIAGNOSIS — M545 Low back pain, unspecified: Secondary | ICD-10-CM

## 2019-10-05 DIAGNOSIS — M25552 Pain in left hip: Secondary | ICD-10-CM

## 2019-10-05 DIAGNOSIS — Z87442 Personal history of urinary calculi: Secondary | ICD-10-CM | POA: Diagnosis not present

## 2019-10-05 LAB — URINALYSIS, ROUTINE W REFLEX MICROSCOPIC
Bilirubin Urine: NEGATIVE
Hgb urine dipstick: NEGATIVE
Ketones, ur: NEGATIVE
Leukocytes,Ua: NEGATIVE
Nitrite: NEGATIVE
RBC / HPF: NONE SEEN (ref 0–?)
Specific Gravity, Urine: 1.015 (ref 1.000–1.030)
Total Protein, Urine: NEGATIVE
Urine Glucose: NEGATIVE
Urobilinogen, UA: 0.2 (ref 0.0–1.0)
WBC, UA: NONE SEEN (ref 0–?)
pH: 6.5 (ref 5.0–8.0)

## 2019-10-05 MED ORDER — NABUMETONE 500 MG PO TABS: 500.0000 mg | ORAL_TABLET | Freq: Two times a day (BID) | ORAL | 0 refills | Status: DC

## 2019-10-05 NOTE — Progress Notes (Signed)
Established Patient Office Visit  Subjective:  Patient ID: Philip [name], male    DOB: Jul 04, 1960  Age: 59 y.o. MRN: [id number]  CC:  Chief Complaint  Patient presents with  . Transitions Of Care    hip pain    HPI Philip [name] is a 59 yo who presents to establish care with a new provider. He has concerns about chronic hip and back pain.   He has had the left hip pain x 3-4 years. He has left lumbar back pain. What is new is the radiation of left hip pain into groin and into anterior thigh x 2 weeks. He now has radiation into the thighs after walking at work for 10-15 min. No radiating pain down the leg, or numbness/tingling or weakness. No exercise outside of work. No trauma-or falls or sports injury. He has not had any X-rays of lower back or hip. He has been taking Advil 200 mg x 2 daily.  HX of kidney stones in 2020 and he saw Dr. Marcy Salvo at [facility]. He passed the stone before a lithotripsy was done. He was placed on Flomax in 2020. Currently, he has noted no dysuria, frequency, hematuria, or flank pain. No fever/chills or malaise.    Past Medical History:  Diagnosis Date  . Smoker     No past surgical history on file.  Family History  Problem Relation Age of Onset  . Early death Mother     Social History   Socioeconomic History  . Marital status: Married    Spouse name: Not on file  . Number of children: Not on file  . Years of education: Not on file  . Highest education level: Not on file  Occupational History  . Not on file  Tobacco Use  . Smoking status: Former Smoker    Packs/day: 0.25    Types: Cigarettes    Quit date: 03/30/2018    Years since quitting: 1.5  . Smokeless tobacco: Never Used  Substance and Sexual Activity  . Alcohol use: Never  . Drug use: Never  . Sexual activity: Yes  Other Topics Concern  . Not on file  Social History Narrative  . Not on file   Social Determinants of Health   Financial Resource Strain:   .  Difficulty of Paying Living Expenses:   Food Insecurity:   . Worried About Programme researcher, broadcasting/film/video in the Last Year:   . Barista in the Last Year:   Transportation Needs:   . Freight forwarder (Medical):   Marland Kitchen Lack of Transportation (Non-Medical):   Physical Activity:   . Days of Exercise per Week:   . Minutes of Exercise per Session:   Stress:   . Feeling of Stress :   Social Connections:   . Frequency of Communication with Friends and Family:   . Frequency of Social Gatherings with Friends and Family:   . Attends Religious Services:   . Active Member of Clubs or Organizations:   . Attends Banker Meetings:   Marland Kitchen Marital Status:   Intimate Partner Violence:   . Fear of Current or Ex-Partner:   . Emotionally Abused:   Marland Kitchen Physically Abused:   . Sexually Abused:     Outpatient Medications Prior to Visit  Medication Sig Dispense Refill  . ibuprofen (ADVIL,MOTRIN) 600 MG tablet Take 1 tablet (600 mg total) by mouth every 6 (six) hours as needed. (Patient not taking: Reported on 10/05/2019) 30 tablet 0  .  omeprazole (PRILOSEC) 20 MG capsule Take 1 capsule (20 mg total) by mouth daily. (Patient not taking: Reported on 10/05/2019) 30 capsule 3  . ondansetron (ZOFRAN-ODT) 8 MG disintegrating tablet Take 1 tablet (8 mg total) by mouth every 8 (eight) hours as needed for nausea or vomiting. (Patient not taking: Reported on 10/05/2019) 20 tablet 0   No facility-administered medications prior to visit.    No Known Allergies   Review of Systems Pertinent positives noted in history of present illness.   Objective:    Physical Exam  Constitutional: He is oriented to person, place, and time. He appears well-developed and well-nourished.  HENT:  Head: Normocephalic.  Abdominal: Soft. There is no abdominal tenderness.  Musculoskeletal:        General: No edema.     Comments: Good range of motion lower back.  Negative straight leg.  Good internal and external range of  motion hip joints.  No point tenderness over either greater trochanter.  Good range of motion knees bilateral, no crepitus.  Good musculature and hamstrings and quads.  Neurovascularity intact in both lower extremities.  He is able to walk on heels and toes without problem.  Gait is normal.  DTRs normal bilateral.  Neurological: He is alert and oriented to person, place, and time.  Skin: Skin is warm and dry. No rash noted.  Psychiatric: He has a normal mood and affect. His behavior is normal.  Vitals reviewed.   BP (!) 148/90 (BP Location: Left Arm, Patient Position: Sitting, Cuff Size: Normal)   Pulse 82   Temp 97.8 F (36.6 C) (Skin)   Ht 5\' 7"  (1.702 m)   Wt 190 lb 6.4 oz (86.4 kg)   SpO2 98%   BMI 29.82 kg/m  Wt Readings from Last 3 Encounters:  10/05/19 190 lb 6.4 oz (86.4 kg)  04/14/18 170 lb (77.1 kg)  03/28/18 184 lb (83.5 kg)   CLINICAL DATA:  Lumbar pain  EXAM: LUMBAR SPINE - COMPLETE 4+ VIEW  COMPARISON:  None  FINDINGS: Lumbosacral junction excluded.  Vertebral body and disc space heights maintained.  Minimal levoconvex lumbar scoliosis.  Few tiny scattered endplate spurs.  No fracture, subluxation or bone destruction identified within visualized portions of lumbar spine.  No gross spondylolysis.  SI joints preserved.  IMPRESSION: Incomplete visualization of upper lumbar spine.  Minimal levoconvex scoliosis and minimal degenerative disc disease changes without acute abnormalities.   Electronically Signed   By: 03/30/18 M.D.   On: 10/05/2019 18:04  CLINICAL DATA:  Lumbar and LEFT hip pain  EXAM: DG HIP (WITH OR WITHOUT PELVIS) 2-3V LEFT  COMPARISON:  None  FINDINGS: Hip and SI joint spaces preserved.  Tiny marginal spur at LEFT femoral head.  No acute fracture, dislocation or bone destruction.  IMPRESSION: Minimal degenerative changes of LEFT hip joint.  No acute abnormalities.   Electronically Signed    By: 10/07/2019 M.D.   On: 10/05/2019 18:05  Health Maintenance Due  Topic Date Due  . COVID-19 Vaccine (1) Never done  . COLONOSCOPY  Never done    There are no preventive care reminders to display for this patient.    Assessment & Plan:   Problem List Items Addressed This Visit      Other   Lumbar back pain   Relevant Medications   nabumetone (RELAFEN) 500 MG tablet   Other Relevant Orders   DG HIP UNILAT WITH PELVIS 2-3 VIEWS LEFT   DG Lumbar Spine Complete  Ambulatory referral to Orthopedic Surgery   Left hip pain - Primary   Relevant Orders   DG HIP UNILAT WITH PELVIS 2-3 VIEWS LEFT   DG Lumbar Spine Complete   Ambulatory referral to Orthopedic Surgery    Other Visit Diagnoses    History of kidney stones       Relevant Orders   Urinalysis, Routine w reflex microscopic      Meds ordered this encounter  Medications  . nabumetone (RELAFEN) 500 MG tablet    Sig: Take 1 tablet (500 mg total) by mouth 2 (two) times daily.    Dispense:  14 tablet    Refill:  0    Order Specific Question:   Supervising Provider    Answer:   Einar Pheasant [phone]    For left hip and back pain- Relafen 500 mg twice a day with food as needed. May take Tylenol. Do not take Advil, Aleve, ibuprofen.  Monitor for stomach upset and stop it if your stomach.  I placed a referral to orthopedic specialist for further evaluation and management.  If back pain and hip pain persist despite orthopedic evaluation,   please return  to the office as needed.   Please follow-up in 3 months for routine preventative health care.  Follow-up: Return in about 3 months (around 01/05/2020).  This visit occurred during the SARS-CoV-2 public health emergency.  Safety protocols were in place, including screening questions prior to the visit, additional usage of staff PPE, and extensive cleaning of exam room while observing appropriate contact time as indicated for disinfecting solutions.    Denice Paradise,  NP

## 2019-10-05 NOTE — Patient Instructions (Addendum)
Please go to the Xray and we will call you with the results.   For left hip and back pain- Relafen 500 mg twice a day with food as needed. May take Tylenol. Do not take Advil, Aleve, ibuprofen.  Monitor for stomach upset and stop it if your stomach  I placed a referral to orthopedic specialist for further evaluation and management.  If back pain and hip pain persist despite orthopedic evaluation,   please return  to the office as needed.   Please follow-up in 3 months for routine preventative health care.   Acute Back Pain, Adult Acute back pain is sudden and usually short-lived. It is often caused by an injury to the muscles and tissues in the back. The injury may result from:  A muscle or ligament getting overstretched or torn (strained). Ligaments are tissues that connect bones to each other. Lifting something improperly can cause a back strain.  Wear and tear (degeneration) of the spinal disks. Spinal disks are circular tissue that provides cushioning between the bones of the spine (vertebrae).  Twisting motions, such as while playing sports or doing yard work.  A hit to the back.  Arthritis. You may have a physical exam, lab tests, and imaging tests to find the cause of your pain. Acute back pain usually goes away with rest and home care. Follow these instructions at home: Managing pain, stiffness, and swelling  Take over-the-counter and prescription medicines only as told by your health care provider.  Your health care provider may recommend applying ice during the first 24-48 hours after your pain starts. To do this: ? Put ice in a plastic bag. ? Place a towel between your skin and the bag. ? Leave the ice on for 20 minutes, 2-3 times a day.  If directed, apply heat to the affected area as often as told by your health care provider. Use the heat source that your health care provider recommends, such as a moist heat pack or a heating pad. ? Place a towel between your skin and  the heat source. ? Leave the heat on for 20-30 minutes. ? Remove the heat if your skin turns bright red. This is especially important if you are unable to feel pain, heat, or cold. You have a greater risk of getting burned. Activity   Do not stay in bed. Staying in bed for more than 1-2 days can delay your recovery.  Sit up and stand up straight. Avoid leaning forward when you sit, or hunching over when you stand. ? If you work at a desk, sit close to it so you do not need to lean over. Keep your chin tucked in. Keep your neck drawn back, and keep your elbows bent at a right angle. Your arms should look like the letter "L." ? Sit high and close to the steering wheel when you drive. Add lower back (lumbar) support to your car seat, if needed.  Take short walks on even surfaces as soon as you are able. Try to increase the length of time you walk each day.  Do not sit, drive, or stand in one place for more than 30 minutes at a time. Sitting or standing for long periods of time can put stress on your back.  Do not drive or use heavy machinery while taking prescription pain medicine.  Use proper lifting techniques. When you bend and lift, use positions that put less stress on your back: ? Nichols your knees. ? Keep the load close  to your body. ? Avoid twisting.  Exercise regularly as told by your health care provider. Exercising helps your back heal faster and helps prevent back injuries by keeping muscles strong and flexible.  Work with a physical therapist to make a safe exercise program, as recommended by your health care provider. Do any exercises as told by your physical therapist. Lifestyle  Maintain a healthy weight. Extra weight puts stress on your back and makes it difficult to have good posture.  Avoid activities or situations that make you feel anxious or stressed. Stress and anxiety increase muscle tension and can make back pain worse. Learn ways to manage anxiety and stress, such  as through exercise. General instructions  Sleep on a firm mattress in a comfortable position. Try lying on your side with your knees slightly bent. If you lie on your back, put a pillow under your knees.  Follow your treatment plan as told by your health care provider. This may include: ? Cognitive or behavioral therapy. ? Acupuncture or massage therapy. ? Meditation or yoga. Contact a health care provider if:  You have pain that is not relieved with rest or medicine.  You have increasing pain going down into your legs or buttocks.  Your pain does not improve after 2 weeks.  You have pain at night.  You lose weight without trying.  You have a fever or chills. Get help right away if:  You develop new bowel or bladder control problems.  You have unusual weakness or numbness in your arms or legs.  You develop nausea or vomiting.  You develop abdominal pain.  You feel faint. Summary  Acute back pain is sudden and usually short-lived.  Use proper lifting techniques. When you bend and lift, use positions that put less stress on your back.  Take over-the-counter and prescription medicines and apply heat or ice as directed by your health care provider. This information is not intended to replace advice given to you by your health care provider. Make sure you discuss any questions you have with your health care provider. Document Revised: 08/17/2018 Document Reviewed: 12/10/2016 Elsevier Patient Education  2020 [employer]  Chronic Back Pain When back pain lasts longer than 3 months, it is called chronic back pain.The cause of your back pain may not be known. Some common causes include:  Wear and tear (degenerative disease) of the bones, ligaments, or disks in your back.  Inflammation and stiffness in your back (arthritis). People who have chronic back pain often go through certain periods in which the pain is more intense (flare-ups). Many people can learn to manage the  pain with home care. Follow these instructions at home: Pay attention to any changes in your symptoms. Take these actions to help with your pain: Activity   Avoid bending and other activities that make the problem worse.  Maintain a proper position when standing or sitting: ? When standing, keep your upper back and neck straight, with your shoulders pulled back. Avoid slouching. ? When sitting, keep your back straight and relax your shoulders. Do not round your shoulders or pull them backward.  Do not sit or stand in one place for long periods of time.  Take brief periods of rest throughout the day. This will reduce your pain. Resting in a lying or standing position is usually better than sitting to rest.  When you are resting for longer periods, mix in some mild activity or stretching between periods of rest. This will help to prevent  stiffness and pain.  Get regular exercise. Ask your health care provider what activities are safe for you.  Do not lift anything that is heavier than 10 lb (4.5 kg). Always use proper lifting technique, which includes: ? Bending your knees. ? Keeping the load close to your body. ? Avoiding twisting.  Sleep on a firm mattress in a comfortable position. Try lying on your side with your knees slightly bent. If you lie on your back, put a pillow under your knees. Managing pain  If directed, apply ice to the painful area. Your health care provider may recommend applying ice during the first 24-48 hours after a flare-up begins. ? Put ice in a plastic bag. ? Place a towel between your skin and the bag. ? Leave the ice on for 20 minutes, 2-3 times per day.  If directed, apply heat to the affected area as often as told by your health care provider. Use the heat source that your health care provider recommends, such as a moist heat pack or a heating pad. ? Place a towel between your skin and the heat source. ? Leave the heat on for 20-30 minutes. ? Remove the  heat if your skin turns bright red. This is especially important if you are unable to feel pain, heat, or cold. You may have a greater risk of getting burned.  Try soaking in a warm tub.  Take over-the-counter and prescription medicines only as told by your health care provider.  Keep all follow-up visits as told by your health care provider. This is important. Contact a health care provider if:  You have pain that is not relieved with rest or medicine. Get help right away if:  You have weakness or numbness in one or both of your legs or feet.  You have trouble controlling your bladder or your bowels.  You have nausea or vomiting.  You have pain in your abdomen.  You have shortness of breath or you faint. This information is not intended to replace advice given to you by your health care provider. Make sure you discuss any questions you have with your health care provider. Document Revised: 08/19/2018 Document Reviewed: 11/05/2016 Elsevier Patient Education  [employer].  Hip Pain The hip is the joint between the upper legs and the lower pelvis. The bones, cartilage, tendons, and muscles of your hip joint support your body and allow you to move around. Hip pain can range from a minor ache to severe pain in one or both of your hips. The pain may be felt on the inside of the hip joint near the groin, or on the outside near the buttocks and upper thigh. You may also have swelling or stiffness in your hip area. Follow these instructions at home: Managing pain, stiffness, and swelling      If directed, put ice on the painful area. To do this: ? Put ice in a plastic bag. ? Place a towel between your skin and the bag. ? Leave the ice on for 20 minutes, 2-3 times a day.  If directed, apply heat to the affected area as often as told by your health care provider. Use the heat source that your health care provider recommends, such as a moist heat pack or a heating pad. ? Place a  towel between your skin and the heat source. ? Leave the heat on for 20-30 minutes. ? Remove the heat if your skin turns bright red. This is especially important if you are  unable to feel pain, heat, or cold. You may have a greater risk of getting burned. Activity  Do exercises as told by your health care provider.  Avoid activities that cause pain. General instructions   Take over-the-counter and prescription medicines only as told by your health care provider.  Keep a journal of your symptoms. Write down: ? How often you have hip pain. ? The location of your pain. ? What the pain feels like. ? What makes the pain worse.  Sleep with a pillow between your legs on your most comfortable side.  Keep all follow-up visits as told by your health care provider. This is important. Contact a health care provider if:  You cannot put weight on your leg.  Your pain or swelling continues or gets worse after one week.  It gets harder to walk.  You have a fever. Get help right away if:  You fall.  You have a sudden increase in pain and swelling in your hip.  Your hip is red or swollen or very tender to touch. Summary  Hip pain can range from a minor ache to severe pain in one or both of your hips.  The pain may be felt on the inside of the hip joint near the groin, or on the outside near the buttocks and upper thigh.  Avoid activities that cause pain.  Write down how often you have hip pain, the location of the pain, what makes it worse, and what it feels like. This information is not intended to replace advice given to you by your health care provider. Make sure you discuss any questions you have with your health care provider. Document Revised: 09/13/2018 Document Reviewed: 09/13/2018 Elsevier Patient Education  2020 [employer].

## 2019-10-27 ENCOUNTER — Telehealth: Payer: Self-pay | Admitting: Nurse Practitioner

## 2019-10-27 NOTE — Telephone Encounter (Signed)
Pt needs X-rays from 5/26 put on a disk for pick-up

## 2019-10-27 NOTE — Telephone Encounter (Signed)
CD burned & placed up front by [facility]

## 2020-01-05 ENCOUNTER — Ambulatory Visit: Payer: BC Managed Care – PPO | Admitting: Nurse Practitioner

## 2020-09-28 IMAGING — DX DG HIP (WITH OR WITHOUT PELVIS) 2-3V*L*
2 series · 2 of 2 positions shown · non-contrast
Comparison: None

CLINICAL DATA: Lumbar and LEFT hip pain

EXAM:
DG HIP (WITH OR WITHOUT PELVIS) 2-3V LEFT

[pelvis standing ap]
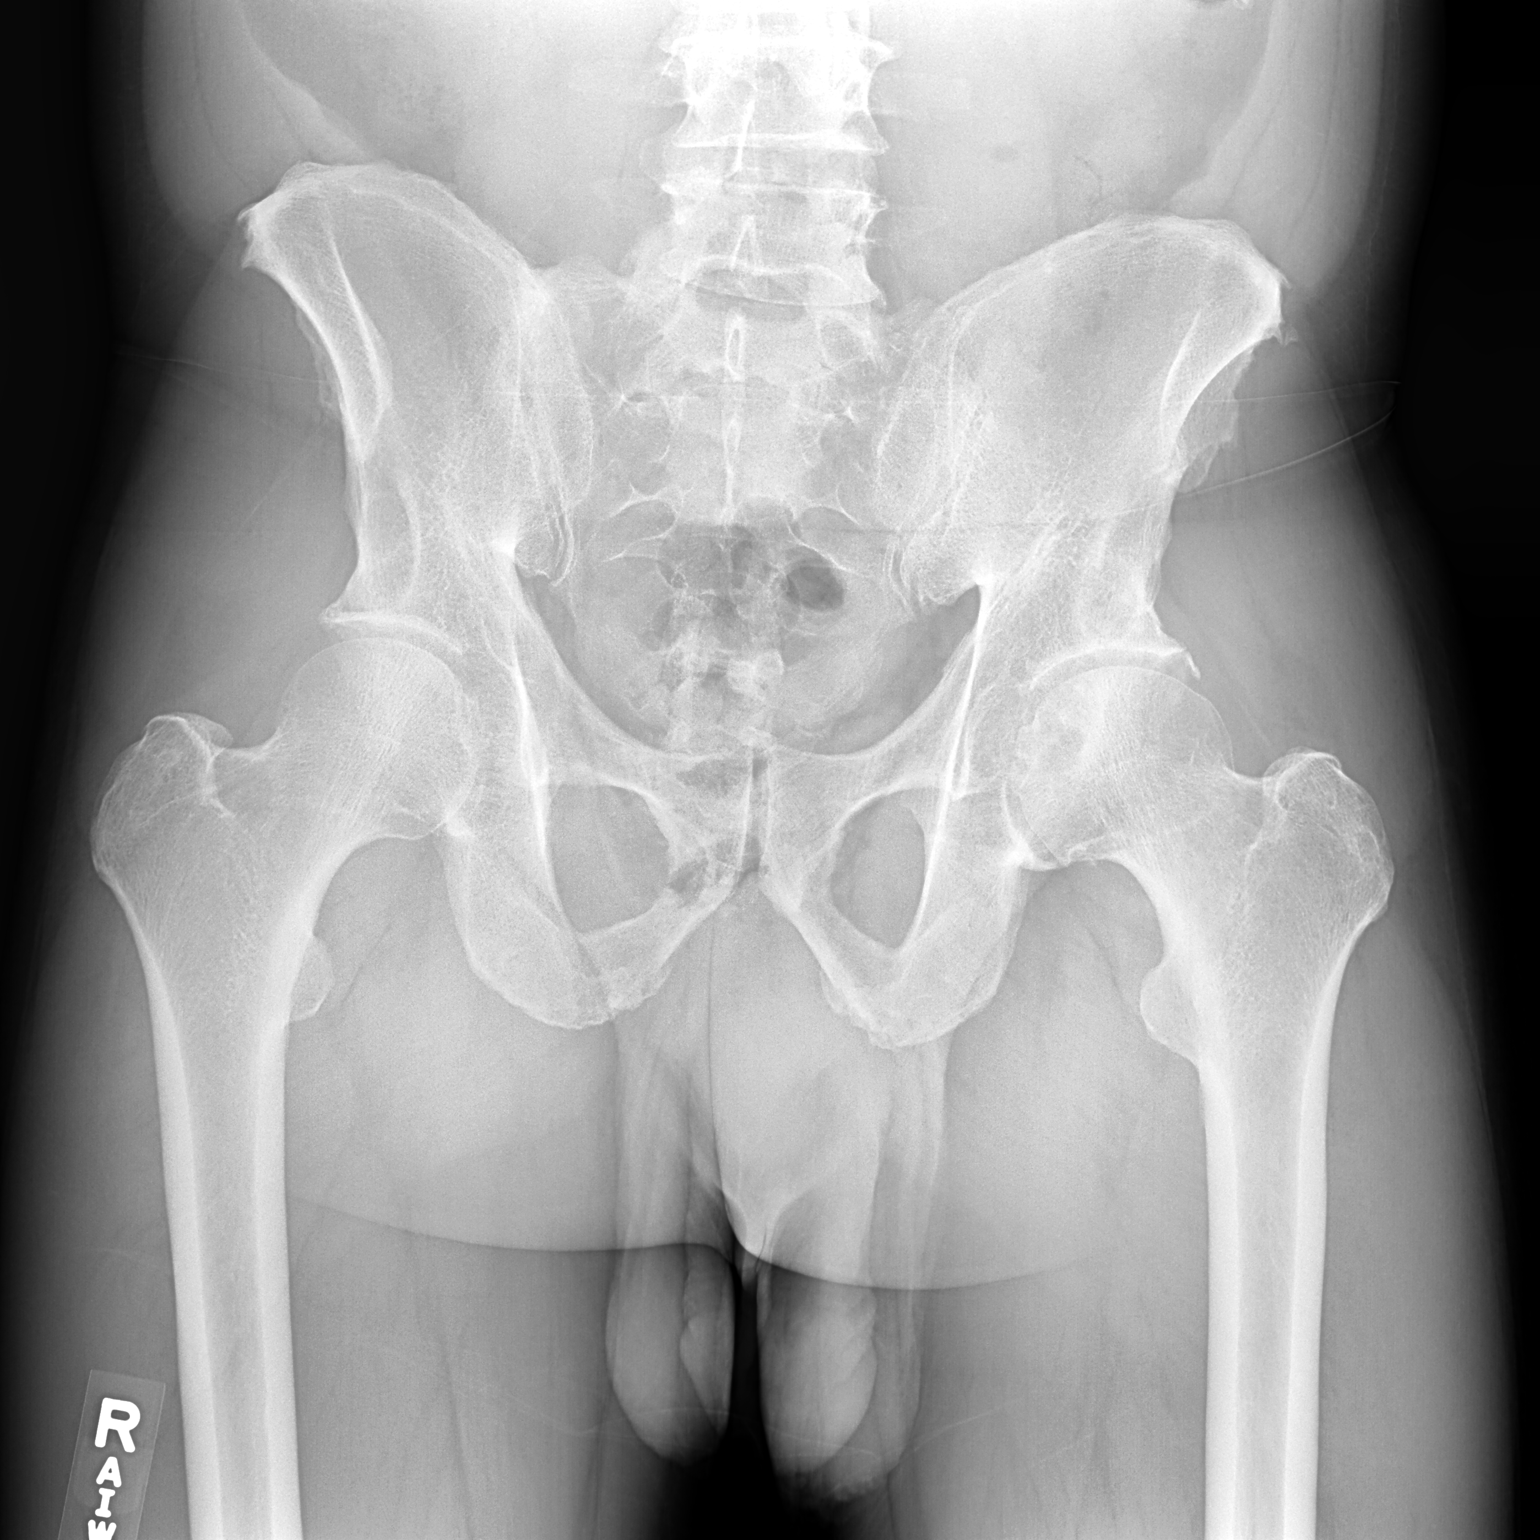

[ap frog obl (oblique)]
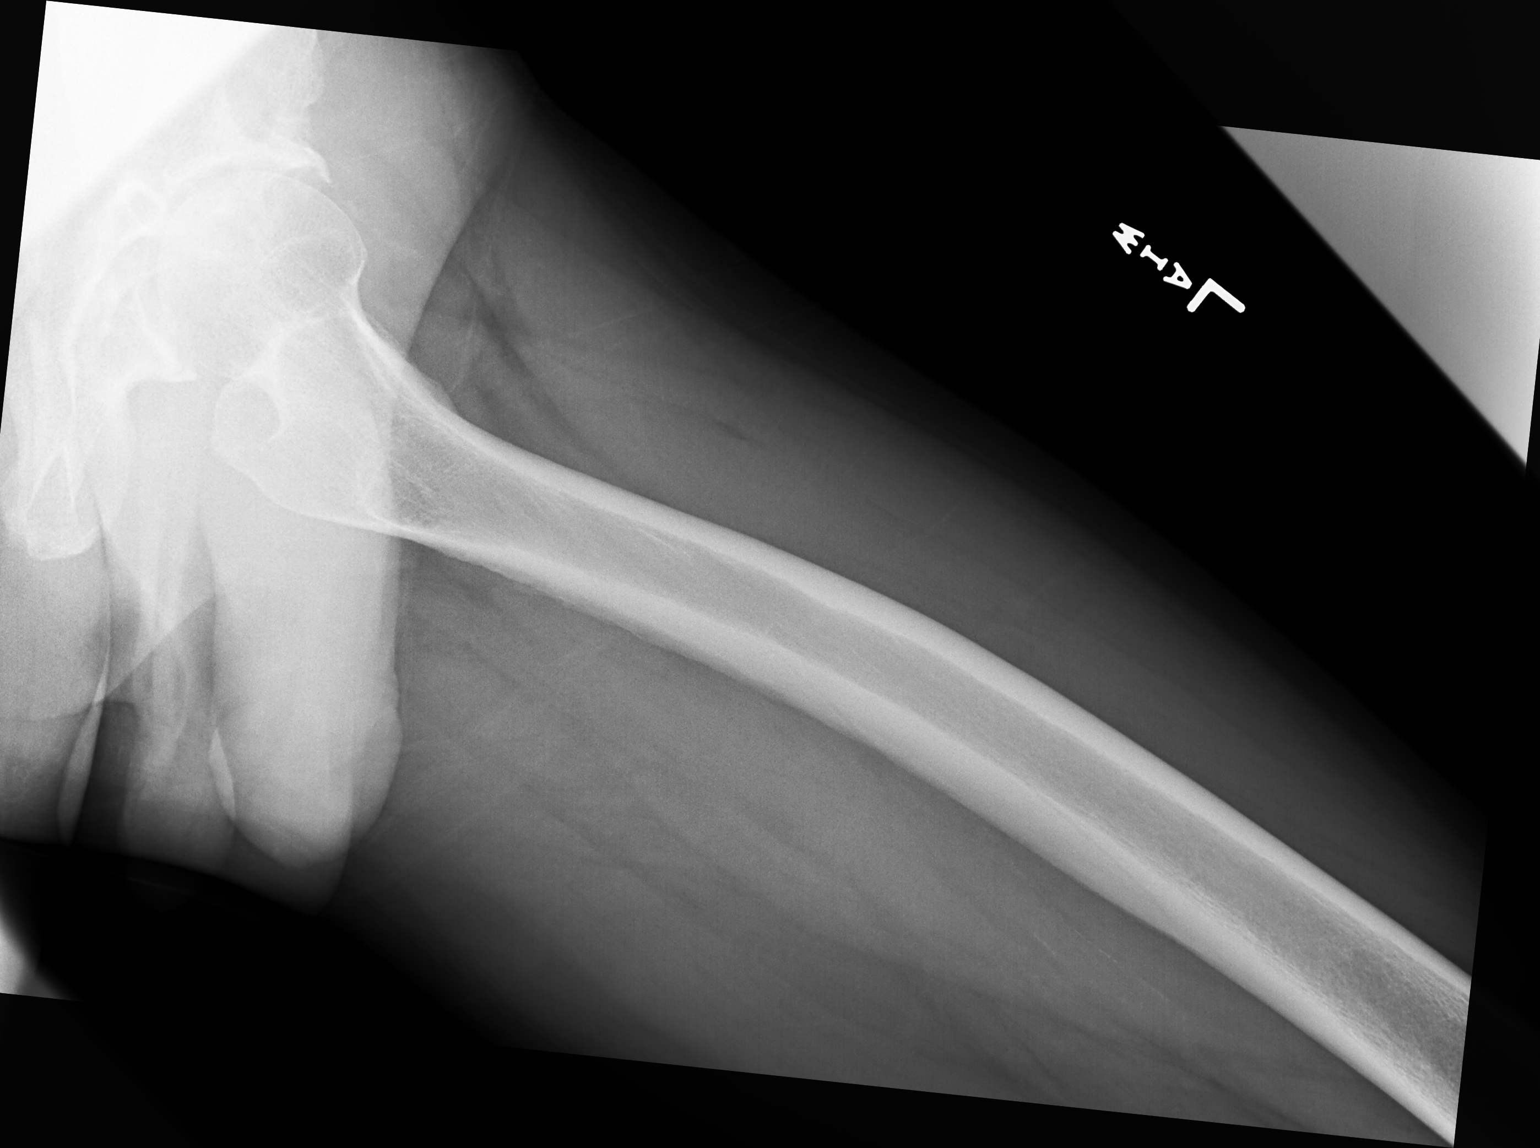

[2 of 2 positions shown; findings below may reference images not displayed]

FINDINGS: Hip and SI joint spaces preserved.

Tiny marginal spur at LEFT femoral head.

No acute fracture, dislocation or bone destruction.
IMPRESSION: Minimal degenerative changes of LEFT hip joint.

No acute abnormalities.

## 2020-11-01 ENCOUNTER — Encounter: Payer: BC Managed Care – PPO | Admitting: Adult Health

## 2020-11-05 ENCOUNTER — Encounter: Payer: Self-pay | Admitting: Nurse Practitioner

## 2020-11-13 ENCOUNTER — Encounter: Payer: BC Managed Care – PPO | Admitting: Adult Health

## 2021-02-04 ENCOUNTER — Encounter: Payer: BC Managed Care – PPO | Admitting: Adult Health

## 2021-05-10 ENCOUNTER — Telehealth: Payer: Self-pay | Admitting: Nurse Practitioner

## 2021-05-10 NOTE — Telephone Encounter (Signed)
Lm to call off to schedule a TOC with [facility], former [facility] patient.

## 2021-09-12 ENCOUNTER — Encounter: Payer: BC Managed Care – PPO | Admitting: Internal Medicine

## 2021-09-17 ENCOUNTER — Encounter: Payer: Self-pay | Admitting: Internal Medicine

## 2021-09-17 ENCOUNTER — Ambulatory Visit (INDEPENDENT_AMBULATORY_CARE_PROVIDER_SITE_OTHER): Payer: 59 | Admitting: Internal Medicine

## 2021-09-17 VITALS — BP 160/100 | HR 94 | Temp 98.6°F | Resp 14 | Ht 67.0 in | Wt 172.1 lb

## 2021-09-17 DIAGNOSIS — E559 Vitamin D deficiency, unspecified: Secondary | ICD-10-CM | POA: Diagnosis not present

## 2021-09-17 DIAGNOSIS — Z125 Encounter for screening for malignant neoplasm of prostate: Secondary | ICD-10-CM

## 2021-09-17 DIAGNOSIS — F17210 Nicotine dependence, cigarettes, uncomplicated: Secondary | ICD-10-CM

## 2021-09-17 DIAGNOSIS — N200 Calculus of kidney: Secondary | ICD-10-CM

## 2021-09-17 DIAGNOSIS — Z1389 Encounter for screening for other disorder: Secondary | ICD-10-CM

## 2021-09-17 DIAGNOSIS — R053 Chronic cough: Secondary | ICD-10-CM | POA: Diagnosis not present

## 2021-09-17 DIAGNOSIS — Z1329 Encounter for screening for other suspected endocrine disorder: Secondary | ICD-10-CM

## 2021-09-17 DIAGNOSIS — J4520 Mild intermittent asthma, uncomplicated: Secondary | ICD-10-CM | POA: Diagnosis not present

## 2021-09-17 DIAGNOSIS — Z1211 Encounter for screening for malignant neoplasm of colon: Secondary | ICD-10-CM

## 2021-09-17 DIAGNOSIS — I10 Essential (primary) hypertension: Secondary | ICD-10-CM

## 2021-09-17 LAB — COMPREHENSIVE METABOLIC PANEL
ALT: 22 U/L (ref 0–53)
AST: 18 U/L (ref 0–37)
Albumin: 4.3 g/dL (ref 3.5–5.2)
Alkaline Phosphatase: 79 U/L (ref 39–117)
BUN: 11 mg/dL (ref 6–23)
CO2: 29 mEq/L (ref 19–32)
Calcium: 9.9 mg/dL (ref 8.4–10.5)
Chloride: 100 mEq/L (ref 96–112)
Creatinine, Ser: 0.93 mg/dL (ref 0.40–1.50)
GFR: 88.89 mL/min (ref >60.00)
Glucose, Bld: 98 mg/dL (ref 70–99)
Potassium: 4.3 mEq/L (ref 3.5–5.1)
Sodium: 137 mEq/L (ref 135–145)
Total Bilirubin: 0.4 mg/dL (ref 0.2–1.2)
Total Protein: 7.1 g/dL (ref 6.0–8.3)

## 2021-09-17 LAB — CBC WITH DIFFERENTIAL/PLATELET
Basophils Absolute: 0 10*3/uL (ref 0.0–0.1)
Basophils Relative: 0.4 % (ref 0.0–3.0)
Eosinophils Absolute: 0.2 10*3/uL (ref 0.0–0.7)
Eosinophils Relative: 4.1 % (ref 0.0–5.0)
HCT: 45.7 % (ref 39.0–52.0)
Hemoglobin: 15.2 g/dL (ref 13.0–17.0)
Lymphocytes Relative: 34 % (ref 12.0–46.0)
Lymphs Abs: 1.5 10*3/uL (ref 0.7–4.0)
MCHC: 33.3 g/dL (ref 30.0–36.0)
MCV: 92.6 fl (ref 78.0–100.0)
Monocytes Absolute: 0.6 10*3/uL (ref 0.1–1.0)
Monocytes Relative: 13.9 % — ABNORMAL HIGH (ref 3.0–12.0)
Neutro Abs: 2.2 10*3/uL (ref 1.4–7.7)
Neutrophils Relative %: 47.6 % (ref 43.0–77.0)
Platelets: 244 10*3/uL (ref 150.0–400.0)
RBC: 4.94 Mil/uL (ref 4.22–5.81)
RDW: 13.7 % (ref 11.5–15.5)
WBC: 4.5 10*3/uL (ref 4.0–10.5)

## 2021-09-17 LAB — LIPID PANEL
Cholesterol: 191 mg/dL (ref 0–200)
HDL: 63 mg/dL (ref >39.00)
LDL Cholesterol: 116 mg/dL — ABNORMAL HIGH (ref 0–99)
NonHDL: 127.7
Total CHOL/HDL Ratio: 3
Triglycerides: 58 mg/dL (ref 0.0–149.0)
VLDL: 11.6 mg/dL (ref 0.0–40.0)

## 2021-09-17 LAB — VITAMIN D 25 HYDROXY (VIT D DEFICIENCY, FRACTURES): VITD: 23.44 ng/mL — ABNORMAL LOW (ref 30.00–100.00)

## 2021-09-17 LAB — TSH: TSH: 1 u[IU]/mL (ref 0.35–5.50)

## 2021-09-17 LAB — PSA: PSA: 1.12 ng/mL (ref 0.10–4.00)

## 2021-09-17 MED ORDER — BUDESONIDE-FORMOTEROL FUMARATE 160-4.5 MCG/ACT IN AERO: 2.0000 | INHALATION_SPRAY | Freq: Two times a day (BID) | RESPIRATORY_TRACT | 12 refills | Status: DC

## 2021-09-17 MED ORDER — ALBUTEROL SULFATE HFA 108 (90 BASE) MCG/ACT IN AERS: 1.0000 | INHALATION_SPRAY | Freq: Four times a day (QID) | RESPIRATORY_TRACT | 11 refills | Status: DC | PRN

## 2021-09-17 NOTE — Patient Instructions (Addendum)
Voltaren gel 4x per day over the counter to chest  ?Try over the counter allergy medication claritin, allegra, zyrtec xyzal  ?Hypertension, Adult ?High blood pressure (hypertension) is when the force of blood pumping through the arteries is too strong. The arteries are the blood vessels that carry blood from the heart throughout the body. Hypertension forces the heart to work harder to pump blood and may cause arteries to become narrow or stiff. Untreated or uncontrolled hypertension can lead to a heart attack, heart failure, a stroke, kidney disease, and other problems. ?A blood pressure reading consists of a higher number over a lower number. Ideally, your blood pressure should be below 120/80. The first ("top") number is called the systolic pressure. It is a measure of the pressure in your arteries as your heart beats. The second ("bottom") number is called the diastolic pressure. It is a measure of the pressure in your arteries as the heart relaxes. ?What are the causes? ?The exact cause of this condition is not known. There are some conditions that result in high blood pressure. ?What increases the risk? ?Certain factors may make you more likely to develop high blood pressure. Some of these risk factors are under your control, including: ?Smoking. ?Not getting enough exercise or physical activity. ?Being overweight. ?Having too much fat, sugar, calories, or salt (sodium) in your diet. ?Drinking too much alcohol. ?Other risk factors include: ?Having a personal history of heart disease, diabetes, high cholesterol, or kidney disease. ?Stress. ?Having a family history of high blood pressure and high cholesterol. ?Having obstructive sleep apnea. ?Age. The risk increases with age. ?What are the signs or symptoms? ?High blood pressure may not cause symptoms. Very high blood pressure (hypertensive crisis) may cause: ?Headache. ?Fast or irregular heartbeats (palpitations). ?Shortness of breath. ?Nosebleed. ?Nausea and  vomiting. ?Vision changes. ?Severe chest pain, dizziness, and seizures. ?How is this diagnosed? ?This condition is diagnosed by measuring your blood pressure while you are seated, with your arm resting on a flat surface, your legs uncrossed, and your feet flat on the floor. The cuff of the blood pressure monitor will be placed directly against the skin of your upper arm at the level of your heart. Blood pressure should be measured at least twice using the same arm. Certain conditions can cause a difference in blood pressure between your right and left arms. ?If you have a high blood pressure reading during one visit or you have normal blood pressure with other risk factors, you may be asked to: ?Return on a different day to have your blood pressure checked again. ?Monitor your blood pressure at home for 1 week or longer. ?If you are diagnosed with hypertension, you may have other blood or imaging tests to help your health care provider understand your overall risk for other conditions. ?How is this treated? ?This condition is treated by making healthy lifestyle changes, such as eating healthy foods, exercising more, and reducing your alcohol intake. You may be referred for counseling on a healthy diet and physical activity. ?Your health care provider may prescribe medicine if lifestyle changes are not enough to get your blood pressure under control and if: ?Your systolic blood pressure is above 130. ?Your diastolic blood pressure is above 80. ?Your personal target blood pressure may vary depending on your medical conditions, your age, and other factors. ?Follow these instructions at home: ?Eating and drinking ? ?Eat a diet that is high in fiber and potassium, and low in sodium, added sugar, and fat. An example  of this eating plan is called the DASH diet. DASH stands for Dietary Approaches to Stop Hypertension. To eat this way: ?Eat plenty of fresh fruits and vegetables. Try to fill one half of your plate at each  meal with fruits and vegetables. ?Eat whole grains, such as whole-wheat pasta, Nessler rice, or whole-grain bread. Fill about one fourth of your plate with whole grains. ?Eat or drink low-fat dairy products, such as skim milk or low-fat yogurt. ?Avoid fatty cuts of meat, processed or cured meats, and poultry with skin. Fill about one fourth of your plate with lean proteins, such as fish, chicken without skin, beans, eggs, or tofu. ?Avoid pre-made and processed foods. These tend to be higher in sodium, added sugar, and fat. ?Reduce your daily sodium intake. Many people with hypertension should eat less than 1,500 mg of sodium a day. ?Do not drink alcohol if: ?Your health care provider tells you not to drink. ?You are pregnant, may be pregnant, or are planning to become pregnant. ?If you drink alcohol: ?Limit how much you have to: ?0-1 drink a day for women. ?0-2 drinks a day for men. ?Know how much alcohol is in your drink. In the [place] one drink equals one 12 oz bottle of beer (355 mL), one 5 oz glass of wine (148 mL), or one 1? oz glass of hard liquor (44 mL). ?Lifestyle ? ?Work with your health care provider to maintain a healthy body weight or to lose weight. Ask what an ideal weight is for you. ?Get at least 30 minutes of exercise that causes your heart to beat faster (aerobic exercise) most days of the week. Activities may include walking, swimming, or biking. ?Include exercise to strengthen your muscles (resistance exercise), such as Pilates or lifting weights, as part of your weekly exercise routine. Try to do these types of exercises for 30 minutes at least 3 days a week. ?Do not use any products that contain nicotine or tobacco. These products include cigarettes, chewing tobacco, and vaping devices, such as e-cigarettes. If you need help quitting, ask your health care provider. ?Monitor your blood pressure at home as told by your health care provider. ?Keep all follow-up visits. This is  important. ?Medicines ?Take over-the-counter and prescription medicines only as told by your health care provider. Follow directions carefully. Blood pressure medicines must be taken as prescribed. ?Do not skip doses of blood pressure medicine. Doing this puts you at risk for problems and can make the medicine less effective. ?Ask your health care provider about side effects or reactions to medicines that you should watch for. ?Contact a health care provider if you: ?Think you are having a reaction to a medicine you are taking. ?Have headaches that keep coming back (recurring). ?Feel dizzy. ?Have swelling in your ankles. ?Have trouble with your vision. ?Get help right away if you: ?Develop a severe headache or confusion. ?Have unusual weakness or numbness. ?Feel faint. ?Have severe pain in your chest or abdomen. ?Vomit repeatedly. ?Have trouble breathing. ?These symptoms may be an emergency. Get help right away. Call 911. ?Do not wait to see if the symptoms will go away. ?Do not drive yourself to the hospital. ?Summary ?Hypertension is when the force of blood pumping through your arteries is too strong. If this condition is not controlled, it may put you at risk for serious complications. ?Your personal target blood pressure may vary depending on your medical conditions, your age, and other factors. For most people, a normal blood pressure is less  than 120/80. ?Hypertension is treated with lifestyle changes, medicines, or a combination of both. Lifestyle changes include losing weight, eating a healthy, low-sodium diet, exercising more, and limiting alcohol. ?This information is not intended to replace advice given to you by your health care provider. Make sure you discuss any questions you have with your health care provider. ?Document Revised: 03/05/2021 Document Reviewed: 03/05/2021 ?Elsevier Patient Education ? [place]. ? ?Cooking With Less Salt ?Cooking with less salt is one way to reduce the amount of  sodium you get from food. Sodium is one of the elements that make up salt. It is found naturally in foods and is also added to certain foods. Depending on your condition and overall health, your health care provider or dietit

## 2021-09-17 NOTE — Progress Notes (Signed)
Chief Complaint  ?Patient presents with  ? Transitions Of Care  ?  Pt would like to disc about ongoing cough x3 mon, chest congestion states he gets SOB has been using albuterol inhaler. States that when he coughs he gets slightly dizzy. Chest pain across chest. Pt was seen at [facility] ED 3 wks ago was given nebulizer tx, had EKG,CXR,Lung x-ray with no findings.   ? ?TOC  ?1. Htn declines tx for now  ?2. Cough x 3 months 08/2021 cxr neg cough and wheezing and sob worse since covid 05/2020 when coughing at times gets dizzy if coughing too much h/o asthma has albuterol inhaler tries otc lozenges with relief and 3 weeks ago neb tx in [facility] ED  ?He has been smoker since 61 y.o now 1 pk will last 3 day s ?At times coughing so much back pain and chest pain  ? ? ?Review of Systems  ?Constitutional:  Negative for weight loss.  ?HENT:  Negative for hearing loss.   ?Eyes:  Negative for blurred vision.  ?Respiratory:  Positive for shortness of breath.   ?Cardiovascular:  Negative for chest pain.  ?Gastrointestinal:  Negative for abdominal pain and blood in stool.  ?Musculoskeletal:  Negative for back pain.  ?Skin:  Negative for rash.  ?Neurological:  Negative for headaches.  ?Psychiatric/Behavioral:  Negative for depression.   ?Past Medical History:  ?Diagnosis Date  ? COVID-19   ? 05/15/20  ? Smoker   ? ?No past surgical history on file. ?Family History  ?Problem Relation Age of Onset  ? Early death Mother   ? ?Social History  ? ?Socioeconomic History  ? Marital status: Married  ?  Spouse name: Not on file  ? Number of children: Not on file  ? Years of education: Not on file  ? Highest education level: Not on file  ?Occupational History  ? Not on file  ?Tobacco Use  ? Smoking status: Former  ?  Packs/day: 0.25  ?  Types: Cigarettes  ?  Quit date: 03/30/2018  ?  Years since quitting: 3.4  ? Smokeless tobacco: Never  ?Vaping Use  ? Vaping Use: Never used  ?Substance and Sexual Activity  ? Alcohol use: Never  ? Drug use: Never  ? Sexual  activity: Yes  ?Other Topics Concern  ? Not on file  ?Social History Narrative  ? Married   ? Works [employer]   ? ?Social Determinants of Health  ? ?Financial Resource Strain: Not on file  ?Food Insecurity: Not on file  ?Transportation Needs: Not on file  ?Physical Activity: Not on file  ?Stress: Not on file  ?Social Connections: Not on file  ?Intimate Partner Violence: Not on file  ? ?Current Meds  ?Medication Sig  ? albuterol (VENTOLIN HFA) 108 (90 Base) MCG/ACT inhaler Inhale 1-2 puffs into the lungs every 6 (six) hours as needed for wheezing or shortness of breath.  ? budesonide-formoterol (SYMBICORT) 160-4.5 MCG/ACT inhaler Inhale 2 puffs into the lungs 2 (two) times daily. Rinse mouth out  ? ?No Known Allergies ?No results found for this or any previous visit (from the past 2160 hour(s)). ?Objective  ?Body mass index is 26.96 kg/m?. ?Wt Readings from Last 3 Encounters:  ?09/17/21 172 lb 1.9 oz (78.1 kg)  ?10/05/19 190 lb 6.4 oz (86.4 kg)  ?04/14/18 170 lb (77.1 kg)  ? ?Temp Readings from Last 3 Encounters:  ?09/17/21 98.6 ?F (37 ?C) (Oral)  ?10/05/19 97.8 ?F (36.6 ?C) (Skin)  ?04/14/18 98.3 ?F (  36.8 ?C) (Oral)  ? ?BP Readings from Last 3 Encounters:  ?09/17/21 (!) 160/100  ?10/05/19 (!) 148/90  ?04/14/18 122/78  ? ?Pulse Readings from Last 3 Encounters:  ?09/17/21 94  ?10/05/19 82  ?04/14/18 86  ? ? ?Physical Exam ?Vitals and nursing note reviewed.  ?Constitutional:   ?   Appearance: Normal appearance. He is well-developed and well-groomed.  ?HENT:  ?   Head: Normocephalic and atraumatic.  ?Eyes:  ?   Conjunctiva/sclera: Conjunctivae normal.  ?   Pupils: Pupils are equal, round, and reactive to light.  ?Cardiovascular:  ?   Rate and Rhythm: Normal rate and regular rhythm.  ?   Heart sounds: Normal heart sounds.  ?Pulmonary:  ?   Effort: Pulmonary effort is normal. No respiratory distress.  ?   Breath sounds: Normal breath sounds.  ?Abdominal:  ?   Tenderness: There is no abdominal tenderness.  ?Skin: ?    General: Skin is warm and moist.  ?Neurological:  ?   General: No focal deficit present.  ?   Mental Status: He is alert and oriented to person, place, and time. Mental status is at baseline.  ?   Sensory: Sensation is intact.  ?   Motor: Motor function is intact.  ?   Coordination: Coordination is intact.  ?   Gait: Gait is intact. Gait normal.  ?Psychiatric:     ?   Attention and Perception: Attention and perception normal.     ?   Mood and Affect: Mood and affect normal.     ?   Speech: Speech normal.     ?   Behavior: Behavior normal. Behavior is cooperative.     ?   Thought Content: Thought content normal.     ?   Cognition and Memory: Cognition and memory normal.     ?   Judgment: Judgment normal.  ? ? ?Assessment  ?Plan  ?Mild intermittent asthma, unspecified whether complicated - Plan: albuterol (VENTOLIN HFA) 108 (90 Base) MCG/ACT inhaler, budesonide-formoterol (SYMBICORT) 160-4.5 MCG/ACT inhaler, Ambulatory referral to Pulmonology for pfts  ?Rec smoking cessation ? ?Cigarette nicotine dependence without complication - Plan: albuterol (VENTOLIN HFA) 108 (90 Base) MCG/ACT inhaler, budesonide-formoterol (SYMBICORT) 160-4.5 MCG/ACT inhaler, Ambulatory referral to Pulmonology ? ?Chronic cough - Plan: albuterol (VENTOLIN HFA) 108 (90 Base) MCG/ACT inhaler, Ambulatory referral to Pulmonology ? ?Hypertension, unspecified type - Plan:declines meds for now given BP log  ? ?HM-CPE at f/u  ?No flu shot ?Covid shots 2/2 moderna  ?Tdap 10/29/17  ?Consider prevnar , shingrix vaccines  ? ?FINDINGS:  ? ?Lungs are clear. No pleural effusion or pneumothorax.  ? ?Unremarkable cardiomediastinal silhouette. ? 08/22/21 ?PSA today  ?Colonoscopy referred  ? ?Encounter for screening colonoscopy - Plan: Ambulatory referral to Gastroenterology  ?Rec smoking cessation  ? ?Provider: Dr. French Ana McLean-Scocuzza-Internal Medicine  ?

## 2021-09-18 ENCOUNTER — Telehealth: Payer: Self-pay

## 2021-09-18 LAB — URINALYSIS, ROUTINE W REFLEX MICROSCOPIC
Bilirubin Urine: NEGATIVE
Glucose, UA: NEGATIVE
Hgb urine dipstick: NEGATIVE
Ketones, ur: NEGATIVE
Leukocytes,Ua: NEGATIVE
Nitrite: NEGATIVE
Protein, ur: NEGATIVE
Specific Gravity, Urine: 1.014 (ref 1.001–1.035)
pH: 6 (ref 5.0–8.0)

## 2021-09-18 NOTE — Telephone Encounter (Signed)
Lvm for pt to return call in regards to labs.  ? ?Per Dr.Tracy: ?Cholesterol ldl elevated goal is <100  ?Rec healthy diet and exercise and stop smoking  ?Blood cts ok  ?Vitamin D low rec D3 4000 IU daily over the counter  ?Liver kidneys normal  ?Thyroid normal  ?PSA prostate cancer screening normal  ?Urine normal  ? ?

## 2021-09-18 NOTE — Telephone Encounter (Signed)
Noted. Thxs  ?

## 2021-09-18 NOTE — Telephone Encounter (Signed)
Patient returned office phone and lab note read from provider. Patient stated ok after note being read.  ?

## 2021-10-02 ENCOUNTER — Ambulatory Visit (INDEPENDENT_AMBULATORY_CARE_PROVIDER_SITE_OTHER): Payer: 59 | Admitting: Pulmonary Disease

## 2021-10-02 ENCOUNTER — Other Ambulatory Visit
Admission: RE | Admit: 2021-10-02 | Discharge: 2021-10-02 | Disposition: A | Discharge status: Home / Self Care | Payer: 59 | Source: Ambulatory Visit | Attending: Pulmonary Disease | Admitting: Pulmonary Disease

## 2021-10-02 ENCOUNTER — Encounter: Payer: Self-pay | Admitting: Pulmonary Disease

## 2021-10-02 VITALS — BP 138/86 | HR 87 | Temp 97.8°F | Ht 67.0 in | Wt 173.6 lb

## 2021-10-02 DIAGNOSIS — R053 Chronic cough: Secondary | ICD-10-CM

## 2021-10-02 DIAGNOSIS — R0602 Shortness of breath: Secondary | ICD-10-CM

## 2021-10-02 DIAGNOSIS — F17210 Nicotine dependence, cigarettes, uncomplicated: Secondary | ICD-10-CM | POA: Diagnosis not present

## 2021-10-02 DIAGNOSIS — J45909 Unspecified asthma, uncomplicated: Secondary | ICD-10-CM

## 2021-10-02 LAB — CBC WITH DIFFERENTIAL/PLATELET
Abs Immature Granulocytes: 0.01 10*3/uL (ref 0.00–0.07)
Basophils Absolute: 0 10*3/uL (ref 0.0–0.1)
Basophils Relative: 0 %
Eosinophils Absolute: 0.2 10*3/uL (ref 0.0–0.5)
Eosinophils Relative: 3 %
HCT: 45.9 % (ref 39.0–52.0)
Hemoglobin: 15.1 g/dL (ref 13.0–17.0)
Immature Granulocytes: 0 %
Lymphocytes Relative: 33 %
Lymphs Abs: 1.8 10*3/uL (ref 0.7–4.0)
MCH: 29.9 pg (ref 26.0–34.0)
MCHC: 32.9 g/dL (ref 30.0–36.0)
MCV: 90.9 fL (ref 80.0–100.0)
Monocytes Absolute: 0.6 10*3/uL (ref 0.1–1.0)
Monocytes Relative: 11 %
Neutro Abs: 2.9 10*3/uL (ref 1.7–7.7)
Neutrophils Relative %: 53 %
Platelets: 256 10*3/uL (ref 150–400)
RBC: 5.05 MIL/uL (ref 4.22–5.81)
RDW: 12.7 % (ref 11.5–15.5)
WBC: 5.4 10*3/uL (ref 4.0–10.5)
nRBC: 0 % (ref 0.0–0.2)

## 2021-10-02 MED ORDER — TRELEGY ELLIPTA 100-62.5-25 MCG/ACT IN AEPB: 1.0000 | INHALATION_SPRAY | Freq: Every day | RESPIRATORY_TRACT | 0 refills | Status: DC

## 2021-10-02 MED ORDER — TRELEGY ELLIPTA 100-62.5-25 MCG/ACT IN AEPB: 1.0000 | INHALATION_SPRAY | Freq: Every day | RESPIRATORY_TRACT | 11 refills | Status: DC

## 2021-10-02 NOTE — Progress Notes (Signed)
Subjective:    Patient ID: Philip [name], male    DOB: 03/17/1961, 61 y.o.   MRN: [id number] Patient Care Team: McLean-Scocuzza, Nino Glow, MD as PCP - General (Internal Medicine)  Chief Complaint  Patient presents with   pulmonary consult    C/o non prod cough, occ sob with exertion and wheezing   HPI Philip [name] is a 61 year old current smoker (6 cigarettes/day, 15 PY) who presents for evaluation of cough and shortness of breath.  He is kindly referred by Dr. Jone Baseman.  The patient states that he had COVID-19 in January 2022.  Since then he has had issues with shortness of breath, cough and wheezing.  This however became worse approximately in February of this year.  He notices that the cough occasionally is productive however he does not know the color as he swallows sputum.  He has not had any hemoptysis.  Occasionally he coughs so forcefully that he gets somewhat dizzy.  He notices chest discomfort and pain across the chest.  He was evaluated at [facility] ED on April 2023 with a negative chest x-ray at that time.  He received nebulization treatment and a prescription for an albuterol inhaler at that time.  He also was given a prednisone short course.  He improved somewhat after that however symptoms have recurred.  He has not had any fevers, chills or sweats.  He has been using his albuterol inhaler almost every 2 hours.  Approximately 2 weeks ago he received a prescription for Symbicort however, he never filled this prescription.  He has not been on maintenance at all.  He does note seasonal variation with his cough and shortness of breath.  He notes that he was having issues with asthma in his early 82s however in his 49s these issues resolved.  He had not had any difficulties until his COVID illness in January.  He has not had any orthopnea or paroxysmal nocturnal dyspnea.  No lower extremity edema.  Shortness of breath occurs mostly with exertion.  Occasionally will notice that at rest.   It is relieved by the albuterol.  He does not have nocturnal awakenings.  Note that his wife tells him he snores but has not informed him of any apneic episodes.  Patient does not endorse any other symptomatology.  Currently he works as a Audiological scientist at [employer].  Has not had any exotic travel.  No military history.  He smokes 5 to 6 cigarettes/day he has a total of a 15-pack-year history of smoking.  He works third shift.   Review of Systems A 10 point review of systems was performed and it is as noted above otherwise negative.  Past Medical History:  Diagnosis Date   COVID-19    05/15/20   Smoker    No past surgical history on file.  Patient Active Problem List   Diagnosis Date Noted   Kidney stones 09/17/2021   Lumbar back pain 10/05/2019   Left hip pain 10/05/2019   Cigarette smoker 02/24/2018   Family History  Problem Relation Age of Onset   Early death Mother    Social History   Tobacco Use   Smoking status: Every Day    Packs/day: 0.50    Years: 30.00    Pack years: 15.00    Types: Cigarettes   Smokeless tobacco: Never   Tobacco comments:    5-6 cigarettes daily 10/02/2021  Substance Use Topics   Alcohol use: Never   No Known Allergies  Current Meds  Medication Sig   albuterol (VENTOLIN HFA) 108 (90 Base) MCG/ACT inhaler Inhale 1-2 puffs into the lungs every 6 (six) hours as needed for wheezing or shortness of breath.   budesonide-formoterol (SYMBICORT) 160-4.5 MCG/ACT inhaler Inhale 2 puffs into the lungs 2 (two) times daily. Rinse mouth out   nabumetone (RELAFEN) 500 MG tablet Take 1 tablet (500 mg total) by mouth 2 (two) times daily.   Immunization History  Administered Date(s) Administered   Tdap 10/29/2017       Objective:   Physical Exam BP 138/86 (BP Location: Left Arm, Cuff Size: Normal)   Pulse 87   Temp 97.8 F (36.6 C) (Temporal)   Ht 5\' 7"  (1.702 m)   Wt 173 lb 9.6 oz (78.7 kg)   SpO2 97%   BMI 27.19 kg/m  GENERAL: Well-developed,  well-nourished gentleman, looks younger than stated age.  Fully ambulatory, no conversational dyspnea. HEAD: Normocephalic, atraumatic.  EYES: Pupils equal, round, reactive to light.  No scleral icterus.  MOUTH: Oral mucosa moist.  No thrush. NECK: Supple. No thyromegaly. Trachea midline. No JVD.  No adenopathy. PULMONARY: Good air entry bilaterally.  No adventitious sounds. CARDIOVASCULAR: S1 and S2. Regular rate and rhythm.  No rubs, murmurs or gallops heard. ABDOMEN: Benign. MUSCULOSKELETAL: No joint deformity, no clubbing, no edema.  NEUROLOGIC: No overt focal deficit, no gait disturbance, speech is fluent. SKIN: Intact,warm,dry. PSYCH: Mood and behavior normal   I have reviewed the recent [facility] ED visit notes to include the chest x-ray report, from 22 August 2021.  I have reviewed the patient's primary care notes.    Assessment & Plan:     ICD-10-CM   1. SOB (shortness of breath)  R06.02 Pulmonary Function Test [facility] Only    CBC w/Diff    Allergen Panel (27) + IGE   I suspect that this is due to poorly controlled asthma He may also have an element of COPD Obtain PFTs    2. Persistent asthma without complication, unspecified asthma severity  J45.909    Will obtain allergen panel given seasonal variation Trial of Trelegy Ellipta 100/62.5/25 1 inhalation daily Continue as needed albuterol every 6 hours only     3. Chronic cough  R05.3    Noted since COVID-30 May 2020 Suspect due to poorly controlled asthma    4. Tobacco dependence due to cigarettes  F17.210    Patient was counseled regards to discontinuation of smoking Total counseling time 3 to 5 minutes      Orders Placed This Encounter  Procedures   CBC w/Diff    Standing Status:   Future    Standing Expiration Date:   10/03/2022   Allergen Panel (27) + IGE    Standing Status:   Future    Standing Expiration Date:   10/03/2022   Pulmonary Function Test [facility] Only    Next available.    Standing Status:   Future     Standing Expiration Date:   10/03/2022    Order Specific Question:   Full PFT: includes the following: basic spirometry, spirometry pre & post bronchodilator, diffusion capacity (DLCO), lung volumes    Answer:   Full PFT   Meds ordered this encounter  Medications   Fluticasone-Umeclidin-Vilant (TRELEGY ELLIPTA) 100-62.5-25 MCG/ACT AEPB    Sig: Inhale 1 puff into the lungs daily.    Dispense:  14 each    Refill:  0   Fluticasone-Umeclidin-Vilant (TRELEGY ELLIPTA) 100-62.5-25 MCG/ACT AEPB    Sig: Inhale 1 puff into the lungs  daily.    Dispense:  28 each    Refill:  11    Patient likely has persistent asthma.  Suspect moderate in nature.  We are giving him a trial of Trelegy Ellipta as above.  He does seem to have seasonal variation and will evaluate with allergen panel and CBC with differential.  We will also obtain PFTs to further evaluate respiratory status.  We will see him in follow-up in 2 to 3 months time he is to contact us prior to that time should any new difficulties arise.   Renold Don, MD Advanced Bronchoscopy PCCM [facility]    *This note was dictated using voice recognition software/Dragon.  Despite best efforts to proofread, errors can occur which can change the meaning. Any transcriptional errors that result from this process are unintentional and may not be fully corrected at the time of dictation.

## 2021-10-02 NOTE — Patient Instructions (Signed)
We are giving you a trial of an inhaler called Trelegy this is 1 puff daily.  Make sure that you your mouth well after you use it.  This will hopefully reduce the need for albuterol rescue inhaler.  You may still use the rescue inhaler up to 4 times daily only if needed.  DO NOT TAKE SYMBICORT.  We are scheduling breathing tests and an allergy test (blood test).  We will see you in follow-up in 2 to 3 months time call sooner should any new problems arise.

## 2021-10-06 LAB — ALLERGEN PANEL (27) + IGE
Alternaria Alternata IgE: <0.1 kU/L
Aspergillus Fumigatus IgE: <0.1 kU/L
Bahia Grass IgE: 1.22 kU/L — AB
Bermuda Grass IgE: 0.53 kU/L — AB
Cat Dander IgE: 2.26 kU/L — AB
Cedar, Mountain IgE: 0.94 kU/L — AB
Cladosporium Herbarum IgE: <0.1 kU/L
Cocklebur IgE: 0.91 kU/L — AB
Cockroach, American IgE: 11.5 kU/L — AB
Common Silver Birch IgE: 0.59 kU/L — AB
D Farinae IgE: 24.9 kU/L — AB
D Pteronyssinus IgE: 18.4 kU/L — AB
Dog Dander IgE: 0.52 kU/L — AB
Elm, American IgE: 1.75 kU/L — AB
Hickory, White IgE: 2.89 kU/L — AB
IgE (Immunoglobulin E), Serum: 286 IU/mL (ref 6–495)
Johnson Grass IgE: 0.49 kU/L — AB
Kentucky Bluegrass IgE: 0.3 kU/L — AB
Maple/Box Elder IgE: 0.41 kU/L — AB
Mucor Racemosus IgE: <0.1 kU/L
Oak, White IgE: 0.38 kU/L — AB
Penicillium Chrysogen IgE: <0.1 kU/L
Pigweed, Rough IgE: 0.66 kU/L — AB
Plantain, English IgE: 0.36 kU/L — AB
Ragweed, Short IgE: 3.08 kU/L — AB
Setomelanomma Rostrat: 0.23 kU/L — AB
Timothy Grass IgE: 0.62 kU/L — AB
White Mulberry IgE: 0.2 kU/L — AB

## 2021-10-08 ENCOUNTER — Telehealth: Payer: Self-pay | Admitting: Pulmonary Disease

## 2021-10-08 DIAGNOSIS — Z9109 Other allergy status, other than to drugs and biological substances: Secondary | ICD-10-CM

## 2021-10-08 NOTE — Telephone Encounter (Signed)
Patient is aware of results and voiced his understanding.  Referral placed to allergy.  Nothing further needed.

## 2021-10-08 NOTE — Telephone Encounter (Signed)
Patient is returning phone call. Patient phone number is [phone].

## 2021-10-08 NOTE — Telephone Encounter (Signed)
Salena Saner, MD  Lajoyce Lauber A, CMA He has allergies to pretty much everything he would benefit from seeing an allergist.   Lm for patient.

## 2021-10-15 ENCOUNTER — Encounter: Payer: Self-pay | Admitting: Internal Medicine

## 2021-10-15 ENCOUNTER — Ambulatory Visit (INDEPENDENT_AMBULATORY_CARE_PROVIDER_SITE_OTHER): Payer: 59 | Admitting: Internal Medicine

## 2021-10-15 VITALS — BP 120/70 | HR 91 | Temp 98.2°F | Resp 14 | Ht 67.0 in | Wt 170.6 lb

## 2021-10-15 DIAGNOSIS — Z0000 Encounter for general adult medical examination without abnormal findings: Secondary | ICD-10-CM

## 2021-10-15 DIAGNOSIS — I10 Essential (primary) hypertension: Secondary | ICD-10-CM | POA: Diagnosis not present

## 2021-10-15 NOTE — Patient Instructions (Addendum)
Goal blood pressure <130/<80 if not I would recommend blood pressure medication norvasc 2.5 mg daily   LDL bad cholesterol goal <100  Vitamin D3 4000 IU total daily   Consider allergy pill otc zyrtec, claritin , allegra, xyzal as needed (your night can make you sleepy)   Think about prevnar 20 pneumonia vaccine here nurse visit or pharmacy   [facility] for colonoscopy  Phone Fax E-mail Address  [phone] Not available [address]   [place] [address]     Specialties     Gastroenterology         Pneumococcal Conjugate Vaccine (Prevnar 20) Suspension for Injection What is this medication? PNEUMOCOCCAL VACCINE (NEU mo KOK al vak SEEN) is a vaccine. It prevents pneumococcus bacterial infections. These bacteria can cause serious infections like pneumonia, meningitis, and blood infections. This vaccine will not treat an infection and will not cause infection. This vaccine is recommended for adults 18 years and older. This medicine may be used for other purposes; ask your health care provider or pharmacist if you have questions. COMMON BRAND NAME(S): Prevnar 20 What should I tell my care team before I take this medication? They need to know if you have any of these conditions: bleeding disorder fever immune system problems an unusual or allergic reaction to pneumococcal vaccine, diphtheria toxoid, other vaccines, other medicines, foods, dyes, or preservatives pregnant or trying to get pregnant breast-feeding How should I use this medication? This vaccine is injected into a muscle. It is given by a health care provider. A copy of Vaccine Information Statements will be given before each vaccination. Be sure to read this information carefully each time. This sheet may change often. Talk to your health care provider about the use of this medicine in children. Special care may be needed. Overdosage: If you think you have taken too much of this medicine  contact a poison control center or emergency room at once. NOTE: This medicine is only for you. Do not share this medicine with others. What if I miss a dose? This does not apply. This medicine is not for regular use. What may interact with this medication? medicines for cancer chemotherapy medicines that suppress your immune function steroid medicines like prednisone or cortisone This list may not describe all possible interactions. Give your health care provider a list of all the medicines, herbs, non-prescription drugs, or dietary supplements you use. Also tell them if you smoke, drink alcohol, or use illegal drugs. Some items may interact with your medicine. What should I watch for while using this medication? Mild fever and pain should go away in 3 days or less. Report any unusual symptoms to your health care provider. What side effects may I notice from receiving this medication? Side effects that you should report to your doctor or health care professional as soon as possible: allergic reactions (skin rash, itching or hives; swelling of the face, lips, or tongue) confusion fast, irregular heartbeat fever over 102 degrees F muscle weakness seizures trouble breathing unusual bruising or bleeding Side effects that usually do not require medical attention (report to your doctor or health care professional if they continue or are bothersome): fever of 102 degrees F or less headache joint pain muscle cramps, pain pain, tender at site where injected This list may not describe all possible side effects. Call your doctor for medical advice about side effects. You may report side effects to FDA at 1-800-FDA-1088. Where should I keep my medication? This vaccine is  only given by a health care provider. It will not be stored at home. NOTE: This sheet is a summary. It may not cover all possible information. If you have questions about this medicine, talk to your doctor, pharmacist, or health  care provider.  2023 Elsevier/Gold Standard (2020-01-12 00:00:00)

## 2021-10-15 NOTE — Progress Notes (Addendum)
Chief Complaint  Patient presents with   Follow-up    BP, no other concerns denies any pain   Annual  1. BP improved will monitor declines BP medication   Review of Systems  Constitutional:  Negative for weight loss.  HENT:  Negative for hearing loss.   Eyes:  Negative for blurred vision.  Respiratory:  Negative for shortness of breath.   Cardiovascular:  Negative for chest pain.  Gastrointestinal:  Negative for abdominal pain and blood in stool.  Musculoskeletal:  Negative for back pain.  Skin:  Negative for rash.  Neurological:  Negative for headaches.  Psychiatric/Behavioral:  Negative for depression.   Past Medical History:  Diagnosis Date   COVID-19    05/15/20   Smoker    No past surgical history on file. Family History  Problem Relation Age of Onset   Early death Mother    Social History   Socioeconomic History   Marital status: Married    Spouse name: Not on file   Number of children: Not on file   Years of education: Not on file   Highest education level: Not on file  Occupational History   Not on file  Tobacco Use   Smoking status: Every Day    Packs/day: 0.50    Years: 30.00    Pack years: 15.00    Types: Cigarettes   Smokeless tobacco: Never   Tobacco comments:    5-6 cigarettes daily 10/02/2021  Vaping Use   Vaping Use: Never used  Substance and Sexual Activity   Alcohol use: Never   Drug use: Never   Sexual activity: Yes  Other Topics Concern   Not on file  Social History Narrative   Married    Works [employer]    Social Determinants of Health   Financial Resource Strain: Not on file  Food Insecurity: Not on file  Transportation Needs: Not on file  Physical Activity: Not on file  Stress: Not on file  Social Connections: Not on file  Intimate Partner Violence: Not on file   Current Meds  Medication Sig   albuterol (VENTOLIN HFA) 108 (90 Base) MCG/ACT inhaler Inhale 1-2 puffs into the lungs every 6 (six) hours as needed for  wheezing or shortness of breath.   No Known Allergies Recent Results (from the past 2160 hour(s))  Comprehensive metabolic panel     Status: None   Collection Time: 09/17/21 10:19 AM  Result Value Ref Range   Sodium 137 135 - 145 mEq/L   Potassium 4.3 3.5 - 5.1 mEq/L   Chloride 100 96 - 112 mEq/L   CO2 29 19 - 32 mEq/L   Glucose, Bld 98 70 - 99 mg/dL   BUN 11 6 - 23 mg/dL   Creatinine, Ser 5.63 0.40 - 1.50 mg/dL   Total Bilirubin 0.4 0.2 - 1.2 mg/dL   Alkaline Phosphatase 79 39 - 117 U/L   AST 18 0 - 37 U/L   ALT 22 0 - 53 U/L   Total Protein 7.1 6.0 - 8.3 g/dL   Albumin 4.3 3.5 - 5.2 g/dL   GFR 87.56 >43.32 mL/min    Comment: Calculated using the CKD-EPI Creatinine Equation (2021)   Calcium 9.9 8.4 - 10.5 mg/dL  Lipid panel     Status: Abnormal   Collection Time: 09/17/21 10:19 AM  Result Value Ref Range   Cholesterol 191 0 - 200 mg/dL    Comment: ATP III Classification       Desirable:  <  200 mg/dL               Borderline High:  200 - 239 mg/dL          High:  > = 119240 mg/dL   Triglycerides 14.758.0 0.0 - 149.0 mg/dL    Comment: Normal:  <829<150 mg/dLBorderline High:  150 - 199 mg/dL   HDL 56.2163.00 >30.86>39.00 mg/dL   VLDL 57.811.6 0.0 - 46.940.0 mg/dL   LDL Cholesterol 629116 (H) 0 - 99 mg/dL   Total CHOL/HDL Ratio 3     Comment:                Men          Women1/2 Average Risk     3.4          3.3Average Risk          5.0          4.42X Average Risk          9.6          7.13X Average Risk          15.0          11.0                       NonHDL 127.70     Comment: NOTE:  Non-HDL goal should be 30 mg/dL higher than patient's LDL goal (i.e. LDL goal of < 70 mg/dL, would have non-HDL goal of < 100 mg/dL)  CBC with Differential/Platelet     Status: Abnormal   Collection Time: 09/17/21 10:19 AM  Result Value Ref Range   WBC 4.5 4.0 - 10.5 K/uL   RBC 4.94 4.22 - 5.81 Mil/uL   Hemoglobin 15.2 13.0 - 17.0 g/dL   HCT 52.845.7 41.339.0 - 24.452.0 %   MCV 92.6 78.0 - 100.0 fl   MCHC 33.3 30.0 - 36.0 g/dL   RDW  01.013.7 27.211.5 - 53.615.5 %   Platelets 244.0 150.0 - 400.0 K/uL   Neutrophils Relative % 47.6 43.0 - 77.0 %   Lymphocytes Relative 34.0 12.0 - 46.0 %   Monocytes Relative 13.9 (H) 3.0 - 12.0 %   Eosinophils Relative 4.1 0.0 - 5.0 %   Basophils Relative 0.4 0.0 - 3.0 %   Neutro Abs 2.2 1.4 - 7.7 K/uL   Lymphs Abs 1.5 0.7 - 4.0 K/uL   Monocytes Absolute 0.6 0.1 - 1.0 K/uL   Eosinophils Absolute 0.2 0.0 - 0.7 K/uL   Basophils Absolute 0.0 0.0 - 0.1 K/uL  TSH     Status: None   Collection Time: 09/17/21 10:19 AM  Result Value Ref Range   TSH 1.00 0.35 - 5.50 uIU/mL  Urinalysis, Routine w reflex microscopic     Status: None   Collection Time: 09/17/21 10:19 AM  Result Value Ref Range   Color, Urine YELLOW YELLOW   APPearance CLEAR CLEAR   Specific Gravity, Urine 1.014 1.001 - 1.035   pH 6.0 5.0 - 8.0   Glucose, UA NEGATIVE NEGATIVE   Bilirubin Urine NEGATIVE NEGATIVE   Ketones, ur NEGATIVE NEGATIVE   Hgb urine dipstick NEGATIVE NEGATIVE   Protein, ur NEGATIVE NEGATIVE   Nitrite NEGATIVE NEGATIVE   Leukocytes,Ua NEGATIVE NEGATIVE  PSA     Status: None   Collection Time: 09/17/21 10:19 AM  Result Value Ref Range   PSA 1.12 0.10 - 4.00 ng/mL    Comment: Test performed using Access Hybritech PSA Assay, a parmagnetic  partical, chemiluminecent immunoassay.  Vitamin D (25 hydroxy)     Status: Abnormal   Collection Time: 09/17/21 10:19 AM  Result Value Ref Range   VITD 23.44 (L) 30.00 - 100.00 ng/mL  Allergen Panel (27) + IGE     Status: Abnormal   Collection Time: 10/02/21 10:30 AM  Result Value Ref Range   Class Description Allergens Comment     Comment: (NOTE)    Levels of Specific IgE       Class  Description of Class    ---------------------------  -----  --------------------                   < 0.10         0         Negative           0.10 -    0.31         0/I       Equivocal/Low           0.32 -    0.55         I         Low           0.56 -    1.40         II        Moderate            1.41 -    3.90         III       High           3.91 -   19.00         IV        Very High          19.01 -  100.00         V         Very High                  >100.00         VI        Very High    IgE (Immunoglobulin E), Serum 286 6 - 495 IU/mL   D Pteronyssinus IgE 18.40 (A) Class IV kU/L   D Farinae IgE 24.90 (A) Class V kU/L   Cat Dander IgE 2.26 (A) Class III kU/L   Dog Dander IgE 0.52 (A) Class I kU/L   [place] Grass IgE 0.53 (A) Class I kU/L   Teddie Grass IgE 0.62 (A) Class II kU/L   [place] Bluegrass IgE 0.30 (A) Class 0/I kU/L   [place] Grass IgE 0.49 (A) Class I kU/L   B[place] Grass IgE 1.22 (A) Class II kU/L   Cockroach, American IgE 11.50 (A) Class IV kU/L    Comment: (NOTE) This test was developed and its performance characteristics determined by [facility].  It has not been cleared or approved by the U.S. Food and Drug Administration. The FDA has determined that such clearance or approval is not necessary. This test is used for clinical purposes.  It should not be regarded as investigational or for research.    Penicillium Chrysogen IgE <0.10 Class 0 kU/L   Cladosporium Herbarum IgE <0.10 Class 0 kU/L   Aspergillus Fumigatus IgE <0.10 Class 0 kU/L   Mucor Racemosus IgE <0.10 Class 0 kU/L   Alternaria Alternata IgE <0.10 Class 0 kU/L   Setomelanomma Rostrat 0.23 (A) Class 0/I kU/L   [place], [place]  IgE 0.38 (A) Class I kU/L   Elm, American IgE 1.75 (A) Class III kU/L   Maple/Box Elder IgE 0.41 (A) Class I kU/L   Common Silver Charletta Cousin IgE 0.59 (A) Class II kU/L   Hickory, White IgE 2.89 (A) Class III kU/L    Comment: (NOTE) This test was developed and its performance characteristics determined by [facility].  It has not been cleared or approved by the U.S. Food and Drug Administration. The FDA has determined that such clearance or approval is not necessary. This test is used for clinical purposes.  It should not be regarded as investigational or for research.     White Mulberry IgE 0.20 (A) Class 0/I kU/L   Cedar, [place] IgE 0.94 (A) Class II kU/L   Ragweed, Short IgE 3.08 (A) Class III kU/L   Plantain, English IgE 0.36 (A) Class I kU/L   Cocklebur IgE 0.91 (A) Class II kU/L   Pigweed, Rough IgE 0.66 (A) Class II kU/L    Comment: (NOTE) Performed At: [facility] [place] [address] [place], [place] [id number] Jolene Schimke MD WU:[id number]   CBC w/Diff     Status: None   Collection Time: 10/02/21 10:30 AM  Result Value Ref Range   WBC 5.4 4.0 - 10.5 K/uL   RBC 5.05 4.22 - 5.81 MIL/uL   Hemoglobin 15.1 13.0 - 17.0 g/dL   HCT 95.6 21.3 - 08.6 %   MCV 90.9 80.0 - 100.0 fL   MCH 29.9 26.0 - 34.0 pg   MCHC 32.9 30.0 - 36.0 g/dL   RDW 57.8 46.9 - 62.9 %   Platelets 256 150 - 400 K/uL   nRBC 0.0 0.0 - 0.2 %   Neutrophils Relative % 53 %   Neutro Abs 2.9 1.7 - 7.7 K/uL   Lymphocytes Relative 33 %   Lymphs Abs 1.8 0.7 - 4.0 K/uL   Monocytes Relative 11 %   Monocytes Absolute 0.6 0.1 - 1.0 K/uL   Eosinophils Relative 3 %   Eosinophils Absolute 0.2 0.0 - 0.5 K/uL   Basophils Relative 0 %   Basophils Absolute 0.0 0.0 - 0.1 K/uL   Immature Granulocytes 0 %   Abs Immature Granulocytes 0.01 0.00 - 0.07 K/uL    Comment: Performed at [facility], [address] [place], [place] [address]   Objective  Body mass index is 26.72 kg/m. Wt Readings from Last 3 Encounters:  10/15/21 170 lb 9.6 oz (77.4 kg)  10/02/21 173 lb 9.6 oz (78.7 kg)  09/17/21 172 lb 1.9 oz (78.1 kg)   Temp Readings from Last 3 Encounters:  10/15/21 98.2 F (36.8 C) (Oral)  10/02/21 97.8 F (36.6 C) (Temporal)  09/17/21 98.6 F (37 C) (Oral)   BP Readings from Last 3 Encounters:  10/15/21 120/70  10/02/21 138/86  09/17/21 (!) 160/100   Pulse Readings from Last 3 Encounters:  10/15/21 91  10/02/21 87  09/17/21 94    Physical Exam Vitals and nursing note reviewed.  Constitutional:      Appearance: Normal appearance. He is well-developed and  well-groomed.  HENT:     Head: Normocephalic and atraumatic.  Eyes:     Conjunctiva/sclera: Conjunctivae normal.     Pupils: Pupils are equal, round, and reactive to light.  Cardiovascular:     Rate and Rhythm: Normal rate and regular rhythm.     Heart sounds: Normal heart sounds.  Pulmonary:     Effort: Pulmonary effort is normal. No respiratory distress.  Breath sounds: Normal breath sounds.  Abdominal:     Tenderness: There is no abdominal tenderness.  Skin:    General: Skin is warm and moist.  Neurological:     General: No focal deficit present.     Mental Status: He is alert and oriented to person, place, and time. Mental status is at baseline.     Sensory: Sensation is intact.     Motor: Motor function is intact.     Coordination: Coordination is intact.     Gait: Gait is intact. Gait normal.  Psychiatric:        Attention and Perception: Attention and perception normal.        Mood and Affect: Mood and affect normal.        Speech: Speech normal.        Behavior: Behavior normal. Behavior is cooperative.        Thought Content: Thought content normal.        Cognition and Memory: Cognition and memory normal.        Judgment: Judgment normal.    Assessment  Plan  Annual physical exam See below  Hypertension, unspecified type  Declines meds will monitor if >130/>80 consider norvasc 2.5 mg qd     No flu shot Covid shots 2/2 moderna declines  Tdap 10/29/17  Consider prevnar 20 in future shingrix vaccine declines   FINDINGS:   Lungs are clear. No pleural effusion or pneumothorax.   Unremarkable cardiomediastinal silhouette.  08/22/21 PSA 1.12 09/17/21  Colonoscopy referred    Encounter for screening colonoscopy - Plan: Ambulatory referral to [facility]   Rec smoking cessation  Provider: Dr. French Ana McLean-Scocuzza-Internal Medicine

## 2021-11-19 ENCOUNTER — Ambulatory Visit: Payer: 59 | Attending: Pulmonary Disease

## 2021-11-19 DIAGNOSIS — J988 Other specified respiratory disorders: Secondary | ICD-10-CM | POA: Insufficient documentation

## 2021-11-19 DIAGNOSIS — R0600 Dyspnea, unspecified: Secondary | ICD-10-CM | POA: Insufficient documentation

## 2021-11-19 DIAGNOSIS — R059 Cough, unspecified: Secondary | ICD-10-CM | POA: Insufficient documentation

## 2021-11-19 DIAGNOSIS — R0602 Shortness of breath: Secondary | ICD-10-CM | POA: Diagnosis not present

## 2021-11-19 LAB — PULMONARY FUNCTION TEST ARMC ONLY
DL/VA % pred: 97 %
DL/VA: 4.16 ml/min/mmHg/L
DLCO unc % pred: 98 %
DLCO unc: 24.5 ml/min/mmHg
FEF 25-75 Post: 2.04 L/sec
FEF 25-75 Pre: 1.09 L/sec
FEF2575-%Change-Post: 87 %
FEF2575-%Pred-Post: 77 %
FEF2575-%Pred-Pre: 41 %
FEV1-%Change-Post: 22 %
FEV1-%Pred-Post: 78 %
FEV1-%Pred-Pre: 64 %
FEV1-Post: 2.53 L
FEV1-Pre: 2.06 L
FEV1FVC-%Change-Post: 6 %
FEV1FVC-%Pred-Pre: 80 %
FEV6-%Change-Post: 14 %
FEV6-%Pred-Post: 94 %
FEV6-%Pred-Pre: 82 %
FEV6-Post: 3.8 L
FEV6-Pre: 3.31 L
FEV6FVC-%Change-Post: 0 %
FEV6FVC-%Pred-Post: 102 %
FEV6FVC-%Pred-Pre: 102 %
FVC-%Change-Post: 14 %
FVC-%Pred-Post: 91 %
FVC-%Pred-Pre: 79 %
FVC-Post: 3.89 L
FVC-Pre: 3.39 L
Post FEV1/FVC ratio: 65 %
Post FEV6/FVC ratio: 98 %
Pre FEV1/FVC ratio: 61 %
Pre FEV6/FVC Ratio: 98 %
RV % pred: 143 %
RV: 3.02 L
TLC % pred: 102 %
TLC: 6.56 L

## 2021-11-19 MED ORDER — ALBUTEROL SULFATE (2.5 MG/3ML) 0.083% IN NEBU
2.5000 mg | INHALATION_SOLUTION | Freq: Once | RESPIRATORY_TRACT | Status: AC
  Administered 2021-11-19: 2.5 mg via RESPIRATORY_TRACT

## 2021-12-17 ENCOUNTER — Ambulatory Visit: Payer: 59 | Admitting: Pulmonary Disease

## 2022-02-12 ENCOUNTER — Ambulatory Visit (INDEPENDENT_AMBULATORY_CARE_PROVIDER_SITE_OTHER): Payer: 59 | Admitting: Pulmonary Disease

## 2022-02-12 ENCOUNTER — Encounter: Payer: Self-pay | Admitting: Pulmonary Disease

## 2022-02-12 ENCOUNTER — Other Ambulatory Visit: Payer: Self-pay

## 2022-02-12 ENCOUNTER — Other Ambulatory Visit
Admission: RE | Admit: 2022-02-12 | Discharge: 2022-02-12 | Disposition: A | Discharge status: Home / Self Care | Payer: 59 | Source: Ambulatory Visit | Attending: Pulmonary Disease | Admitting: Pulmonary Disease

## 2022-02-12 VITALS — BP 148/90 | HR 93 | Temp 97.8°F | Ht 67.0 in | Wt 176.4 lb

## 2022-02-12 DIAGNOSIS — Z9109 Other allergy status, other than to drugs and biological substances: Secondary | ICD-10-CM

## 2022-02-12 DIAGNOSIS — F17210 Nicotine dependence, cigarettes, uncomplicated: Secondary | ICD-10-CM | POA: Diagnosis not present

## 2022-02-12 DIAGNOSIS — J4540 Moderate persistent asthma, uncomplicated: Secondary | ICD-10-CM | POA: Insufficient documentation

## 2022-02-12 LAB — NITRIC OXIDE: Nitric Oxide: 80

## 2022-02-12 MED ORDER — TRELEGY ELLIPTA 200-62.5-25 MCG/ACT IN AEPB
1.0000 | INHALATION_SPRAY | Freq: Every day | RESPIRATORY_TRACT | 11 refills | Status: DC
  Filled 2022-02-12 – 2022-03-31 (×2): qty 60, 30d supply, fill #0

## 2022-02-12 MED ORDER — TRELEGY ELLIPTA 200-62.5-25 MCG/ACT IN AEPB: 1.0000 | INHALATION_SPRAY | Freq: Every day | RESPIRATORY_TRACT | 0 refills | Status: DC

## 2022-02-12 NOTE — Patient Instructions (Signed)
You have moderate to severe allergic asthma.  Your inflammation level today was 80 (this should be 25 or below).  We have sent a prescription for Trelegy Ellipta 200 which is a little bit stronger that what was given to you previously.  You need to take this once a day.  Make sure you rinse your mouth well after you use it.  We are checking a blood test to detect to see if you have any allergic reaction to the rabbit fur.  I recommend that you have the least contact with anything with the rabbit at your home.  Quit smoking.  We will see you back in 3 months time call sooner should any new problems arise.

## 2022-02-12 NOTE — Progress Notes (Signed)
Subjective:    [name] ID: Philip [name], male    DOB: [birth year], 61 y.o.   MRN: [id number] [name] Care Team: McLean-Scocuzza, Pasty Spillers, MD as PCP - General (Internal Medicine)  Chief Complaint  [name] presents with   Follow-up    SOB is better.  Occasional wheezing. Occasional cough with sputum.    HPI Philip [name] is a 61 year old current smoker (6 cigarettes/day, 15 PY) who presents for follow-up of cough and shortness of breath.  We first evaluated Philip [name] on 02 Oct 2021 for Philip issues of shortness of breath, cough and wheezing.  Philip [name] had PFTs are consistent with asthma.  He also had a very positive allergen profile.  He does note that he has seasonal variation to Philip symptoms.  He does note some increased clear mucus production that he has to expectorate during Philip day.  This at times is tenacious.  Had allergy evaluation (notes not available) and was told that he was allergic to a type of hay used for bedding in animal cages.  He states that there is a pet rabbit in Philip home.  He was not tested specifically for allergy to rabbit dander.   At his first visit here we started him on Trelegy Ellipta, inexplicably he has stopped this medication.  He continues to smoke 5 to 6 cigarettes/day.  DATA 10/02/2021 eosinophils absolute/allergen panel: EOS abs 0.6 K/uL, IgE 286, allergen panel positive across-Philip-board particularly to dust mites cat dander grasses American cockroach hickory, elm, ragweed.  Other allergens to lesser degree. 11/19/2021 PFTs: FEV1 2.06 L or 64% predicted, FVC 3.39 L or 79% predicted, FEV1/FVC 61%, there is significant bronchodilator response, diffusion capacity normal.  Consistent with airway obstruction, asthmatic type.   Review of Systems A 10 point review of systems was performed and it is as noted above otherwise negative.  [name] Active Problem List   Diagnosis Date Noted   Moderate to severe persistent allergic asthma 02/12/2022   Kidney stones  09/17/2021   Lumbar back pain 10/05/2019   Left hip pain 10/05/2019   Cigarette smoker 02/24/2018   Social History   Tobacco Use   Smoking status: Every Day    Packs/day: 0.50    Years: 30.00    Total pack years: 15.00    Types: Cigarettes   Smokeless tobacco: Never   Tobacco comments:    5-6 cigarettes daily 10/02/2021  Substance Use Topics   Alcohol use: Never   No Known Allergies  Current Meds  Medication Sig   albuterol (VENTOLIN HFA) 108 (90 Base) MCG/ACT inhaler Inhale 1-2 puffs into Philip lungs every 6 (six) hours as needed for wheezing or shortness of breath.   Immunization History  Administered Date(s) Administered   Tdap 10/29/2017      Objective:   Physical Exam BP (!) 148/90 (BP Location: Left Arm, Cuff Size: Normal)   Pulse 93   Temp 97.8 F (36.6 C)   Ht 5\' 7"  (1.702 m)   Wt 176 lb 6.4 oz (80 kg)   SpO2 98%   BMI 27.63 kg/m  GENERAL: Well-developed, well-nourished Philip [name], looks younger than stated age.  Fully ambulatory, no conversational dyspnea. HEAD: Normocephalic, atraumatic.  EYES: Pupils equal, round, reactive to light.  No scleral icterus.  MOUTH: Oral mucosa moist.  No thrush. NECK: Supple. No thyromegaly. Trachea midline. No JVD.  No adenopathy. PULMONARY: Good air entry bilaterally.  Faint end expiratory wheeze particularly in Philip upper lung zones. CARDIOVASCULAR: S1 and S2. Regular rate and rhythm.  No rubs, murmurs or gallops heard. ABDOMEN: Benign. MUSCULOSKELETAL: No joint deformity, no clubbing, no edema.  NEUROLOGIC: No overt focal deficit, no gait disturbance, speech is fluent. SKIN: Intact,warm,dry. PSYCH: Mood and behavior normal   Recent Results (from Philip past 2160 hour(s))  Pulmonary Function Test [facility] Only     Status: None   Collection Time: 11/19/21 10:00 AM  Result Value Ref Range   FVC-Pre 3.39 L   FVC-%Pred-Pre 79 %   FVC-Post 3.89 L   FVC-%Pred-Post 91 %   FVC-%Change-Post 14 %   FEV1-Pre 2.06 L   FEV1-%Pred-Pre  64 %   FEV1-Post 2.53 L   FEV1-%Pred-Post 78 %   FEV1-%Change-Post 22 %   FEV6-Pre 3.31 L   FEV6-%Pred-Pre 82 %   FEV6-Post 3.80 L   FEV6-%Pred-Post 94 %   FEV6-%Change-Post 14 %   Pre FEV1/FVC ratio 61 %   FEV1FVC-%Pred-Pre 80 %   Post FEV1/FVC ratio 65 %   FEV1FVC-%Change-Post 6 %   Pre FEV6/FVC Ratio 98 %   FEV6FVC-%Pred-Pre 102 %   Post FEV6/FVC ratio 98 %   FEV6FVC-%Pred-Post 102 %   FEV6FVC-%Change-Post 0 %   FEF 25-75 Pre 1.09 L/sec   FEF2575-%Pred-Pre 41 %   FEF 25-75 Post 2.04 L/sec   FEF2575-%Pred-Post 77 %   FEF2575-%Change-Post 87 %   RV 3.02 L   RV % pred 143 %   TLC 6.56 L   TLC % pred 102 %   DLCO unc 24.50 ml/min/mmHg   DLCO unc % pred 98 %   DL/VA 4.16 ml/min/mmHg/L   DL/VA % pred 97 %  Nitric oxide     Status: None   Collection Time: 02/12/22  9:27 AM  Result Value Ref Range   Nitric Oxide 80       Assessment & Plan:     ICD-10-CM   1. Moderate to severe persistent allergic asthma  J45.40 Rabbit Epithelia IgE    Nitric oxide   Poorly controlled FeNO 80 today indicating type II inflammation and airway Resume Trelegy at 200 mcg dose Reiterated importance of daily inhaler use    2. Environmental allergies  Z91.09    Multiple allergies Has rabbit in Philip home Check for allergy towards rabbit epithelium    3. Tobacco dependence due to cigarettes  F17.210    Counseled with regards to discontinuation of smoking     Orders Placed This Encounter  Procedures   Rabbit Epithelia IgE    Standing Status:   Future    Standing Expiration Date:   02/13/2023   Nitric oxide   Meds ordered this encounter  Medications   Fluticasone-Umeclidin-Vilant (TRELEGY ELLIPTA) 200-62.5-25 MCG/ACT AEPB    Sig: Inhale 1 puff into Philip lungs daily.    Dispense:  60 each    Refill:  11    package size 60 each   Fluticasone-Umeclidin-Vilant (TRELEGY ELLIPTA) 200-62.5-25 MCG/ACT AEPB    Sig: Inhale 1 puff into Philip lungs daily.    Dispense:  28 each    Refill:  0     Order Specific Question:   Lot Number?    Answer:   [id number]    Order Specific Question:   Expiration Date?    Answer:   07/11/2023    Order Specific Question:   Quantity    Answer:   1   Philip [name] was apprised as to Philip proper management of allergic asthma.  He has moderate to severe persistent allergic asthma.  His nitric oxide today  was noted to be 80 which is indicative of type II inflammation in Philip airway and consistent with asthma.  Philip [name] was advised to use Trelegy Ellipta daily.  He inexplicably discontinued this medication.  We will restart but increase Philip dose to 200 mcg for Philip ICS component as Philip [name] still has significant inflammation in Philip airway.  We will also check IgE towards rabbit epithelia.  Philip [name] was counseled with regards to allergen avoidance.  [name] was also counseled regards to discontinuation of smoking.  We will see Philip [name] in follow-up in 3 months time he is to call sooner should any new problems arise.  Gailen Shelter, MD Advanced Bronchoscopy PCCM [facility]    *This note was dictated using voice recognition software/Dragon.  Despite best efforts to proofread, errors can occur which can change Philip meaning. Any transcriptional errors that result from this process are unintentional and may not be fully corrected at Philip time of dictation.

## 2022-02-14 LAB — MISC LABCORP TEST (SEND OUT): Labcorp test code: 602700

## 2022-02-20 ENCOUNTER — Other Ambulatory Visit: Payer: Self-pay

## 2022-03-06 ENCOUNTER — Other Ambulatory Visit: Payer: Self-pay

## 2022-03-10 ENCOUNTER — Encounter: Payer: Self-pay | Admitting: Internal Medicine

## 2022-03-10 ENCOUNTER — Emergency Department: Payer: 59

## 2022-03-10 ENCOUNTER — Observation Stay: Payer: 59

## 2022-03-10 ENCOUNTER — Inpatient Hospital Stay
Admission: EM | Admit: 2022-03-10 | Discharge: 2022-03-13 | DRG: 202 | Disposition: A | Discharge status: Home / Self Care | Payer: 59 | Attending: Internal Medicine | Admitting: Internal Medicine

## 2022-03-10 DIAGNOSIS — J9601 Acute respiratory failure with hypoxia: Secondary | ICD-10-CM | POA: Diagnosis not present

## 2022-03-10 DIAGNOSIS — J45901 Unspecified asthma with (acute) exacerbation: Secondary | ICD-10-CM | POA: Diagnosis not present

## 2022-03-10 DIAGNOSIS — J479 Bronchiectasis, uncomplicated: Secondary | ICD-10-CM | POA: Diagnosis present

## 2022-03-10 DIAGNOSIS — F17210 Nicotine dependence, cigarettes, uncomplicated: Secondary | ICD-10-CM | POA: Diagnosis present

## 2022-03-10 DIAGNOSIS — M79604 Pain in right leg: Secondary | ICD-10-CM | POA: Diagnosis present

## 2022-03-10 DIAGNOSIS — R000 Tachycardia, unspecified: Secondary | ICD-10-CM | POA: Diagnosis present

## 2022-03-10 DIAGNOSIS — Z1152 Encounter for screening for COVID-19: Secondary | ICD-10-CM

## 2022-03-10 DIAGNOSIS — B9789 Other viral agents as the cause of diseases classified elsewhere: Secondary | ICD-10-CM | POA: Diagnosis present

## 2022-03-10 DIAGNOSIS — J4551 Severe persistent asthma with (acute) exacerbation: Secondary | ICD-10-CM | POA: Diagnosis present

## 2022-03-10 DIAGNOSIS — J209 Acute bronchitis, unspecified: Secondary | ICD-10-CM | POA: Diagnosis present

## 2022-03-10 DIAGNOSIS — M79605 Pain in left leg: Secondary | ICD-10-CM | POA: Diagnosis present

## 2022-03-10 DIAGNOSIS — Z8616 Personal history of COVID-19: Secondary | ICD-10-CM

## 2022-03-10 LAB — CBC
HCT: 45.6 % (ref 39.0–52.0)
Hemoglobin: 15.5 g/dL (ref 13.0–17.0)
MCH: 30 pg (ref 26.0–34.0)
MCHC: 34 g/dL (ref 30.0–36.0)
MCV: 88.2 fL (ref 80.0–100.0)
Platelets: 225 10*3/uL (ref 150–400)
RBC: 5.17 MIL/uL (ref 4.22–5.81)
RDW: 12.7 % (ref 11.5–15.5)
WBC: 5.7 10*3/uL (ref 4.0–10.5)
nRBC: 0 % (ref 0.0–0.2)

## 2022-03-10 LAB — COMPREHENSIVE METABOLIC PANEL
ALT: 27 U/L (ref 0–44)
AST: 33 U/L (ref 15–41)
Albumin: 4.1 g/dL (ref 3.5–5.0)
Alkaline Phosphatase: 68 U/L (ref 38–126)
Anion gap: 5 (ref 5–15)
BUN: 13 mg/dL (ref 8–23)
CO2: 28 mmol/L (ref 22–32)
Calcium: 9.5 mg/dL (ref 8.9–10.3)
Chloride: 106 mmol/L (ref 98–111)
Creatinine, Ser: 1.08 mg/dL (ref 0.61–1.24)
GFR, Estimated: >60 mL/min (ref >60)
Glucose, Bld: 105 mg/dL — ABNORMAL HIGH (ref 70–99)
Potassium: 4 mmol/L (ref 3.5–5.1)
Sodium: 139 mmol/L (ref 135–145)
Total Bilirubin: 0.6 mg/dL (ref 0.3–1.2)
Total Protein: 7.8 g/dL (ref 6.5–8.1)

## 2022-03-10 LAB — SARS CORONAVIRUS 2 BY RT PCR: SARS Coronavirus 2 by RT PCR: NEGATIVE

## 2022-03-10 LAB — TROPONIN I (HIGH SENSITIVITY)
Troponin I (High Sensitivity): 8 ng/L (ref <18)
Troponin I (High Sensitivity): 8 ng/L (ref <18)

## 2022-03-10 MED ORDER — ALBUTEROL SULFATE HFA 108 (90 BASE) MCG/ACT IN AERS
2.0000 | INHALATION_SPRAY | RESPIRATORY_TRACT | Status: DC | PRN
  Filled 2022-03-10: qty 6.7

## 2022-03-10 MED ORDER — IPRATROPIUM-ALBUTEROL 0.5-2.5 (3) MG/3ML IN SOLN
3.0000 mL | Freq: Three times a day (TID) | RESPIRATORY_TRACT | Status: DC
  Administered 2022-03-10 – 2022-03-11 (×2): 3 mL via RESPIRATORY_TRACT
  Filled 2022-03-10 (×2): qty 3

## 2022-03-10 MED ORDER — PREDNISONE 20 MG PO TABS
40.0000 mg | ORAL_TABLET | Freq: Every day | ORAL | Status: DC
  Administered 2022-03-12 – 2022-03-13 (×2): 40 mg via ORAL
  Filled 2022-03-10 (×2): qty 2

## 2022-03-10 MED ORDER — NICOTINE 14 MG/24HR TD PT24: 14.0000 mg | MEDICATED_PATCH | Freq: Every day | TRANSDERMAL | Status: DC | PRN

## 2022-03-10 MED ORDER — METHYLPREDNISOLONE SODIUM SUCC 40 MG IJ SOLR
40.0000 mg | Freq: Two times a day (BID) | INTRAMUSCULAR | Status: AC
  Administered 2022-03-11 (×2): 40 mg via INTRAVENOUS
  Filled 2022-03-10 (×2): qty 1

## 2022-03-10 MED ORDER — SENNOSIDES-DOCUSATE SODIUM 8.6-50 MG PO TABS: 1.0000 | ORAL_TABLET | Freq: Every evening | ORAL | Status: DC | PRN

## 2022-03-10 MED ORDER — IPRATROPIUM-ALBUTEROL 0.5-2.5 (3) MG/3ML IN SOLN
3.0000 mL | Freq: Once | RESPIRATORY_TRACT | Status: AC
  Administered 2022-03-10: 3 mL via RESPIRATORY_TRACT
  Filled 2022-03-10: qty 3

## 2022-03-10 MED ORDER — ALBUTEROL SULFATE (2.5 MG/3ML) 0.083% IN NEBU
5.0000 mg | INHALATION_SOLUTION | Freq: Once | RESPIRATORY_TRACT | Status: AC
  Administered 2022-03-10: 5 mg via RESPIRATORY_TRACT
  Filled 2022-03-10: qty 6

## 2022-03-10 MED ORDER — LACTATED RINGERS IV BOLUS
1000.0000 mL | Freq: Once | INTRAVENOUS | Status: AC
  Administered 2022-03-10: 1000 mL via INTRAVENOUS

## 2022-03-10 MED ORDER — ACETAMINOPHEN 650 MG RE SUPP: 650.0000 mg | Freq: Four times a day (QID) | RECTAL | Status: DC | PRN

## 2022-03-10 MED ORDER — ACETAMINOPHEN 325 MG PO TABS: 650.0000 mg | ORAL_TABLET | Freq: Four times a day (QID) | ORAL | Status: DC | PRN

## 2022-03-10 MED ORDER — ENOXAPARIN SODIUM 40 MG/0.4ML IJ SOSY
40.0000 mg | PREFILLED_SYRINGE | INTRAMUSCULAR | Status: DC
  Administered 2022-03-10 – 2022-03-12 (×3): 40 mg via SUBCUTANEOUS
  Filled 2022-03-10 (×3): qty 0.4

## 2022-03-10 MED ORDER — ONDANSETRON HCL 4 MG PO TABS: 4.0000 mg | ORAL_TABLET | Freq: Four times a day (QID) | ORAL | Status: DC | PRN

## 2022-03-10 MED ORDER — IOHEXOL 350 MG/ML SOLN
75.0000 mL | Freq: Once | INTRAVENOUS | Status: AC | PRN
  Administered 2022-03-10: 75 mL via INTRAVENOUS

## 2022-03-10 MED ORDER — MAGNESIUM SULFATE 2 GM/50ML IV SOLN
2.0000 g | Freq: Once | INTRAVENOUS | Status: AC
  Administered 2022-03-10: 2 g via INTRAVENOUS
  Filled 2022-03-10: qty 50

## 2022-03-10 MED ORDER — METHYLPREDNISOLONE SODIUM SUCC 125 MG IJ SOLR
125.0000 mg | Freq: Once | INTRAMUSCULAR | Status: AC
  Administered 2022-03-10: 125 mg via INTRAVENOUS
  Filled 2022-03-10: qty 2

## 2022-03-10 MED ORDER — METOPROLOL TARTRATE 5 MG/5ML IV SOLN
5.0000 mg | INTRAVENOUS | Status: DC | PRN
  Administered 2022-03-10: 5 mg via INTRAVENOUS
  Filled 2022-03-10: qty 5

## 2022-03-10 MED ORDER — ONDANSETRON HCL 4 MG/2ML IJ SOLN: 4.0000 mg | Freq: Four times a day (QID) | INTRAMUSCULAR | Status: DC | PRN

## 2022-03-10 NOTE — ED Triage Notes (Signed)
Pt sts that he has been having SOB over the last couple of days. Pt sts that he has asthma and nothing is helping him. Pt is A/Ox4, HR 124, Skin P/WD

## 2022-03-10 NOTE — ED Provider Notes (Signed)
[facility] Provider Note    Event Date/Time   First MD Initiated Contact with Patient 03/10/22 1922     (approximate)   History   Shortness of Breath   HPI  Philip [name] is a 61 y.o. male past medical history of asthma who presents with shortness of breath.  He has been feeling short of breath over the last 3 days.  Has been using his home trilogy and albuterol inhaler.  Has had cough productive of clear sputum.  Did recently have his influenza vaccine.  Denies pain or swelling in his legs.  No history of DVT/PE.  Has never had to be intubated or admitted for asthma.     Past Medical History:  Diagnosis Date   COVID-19    05/15/20   Smoker     Patient Active Problem List   Diagnosis Date Noted   Moderate to severe persistent allergic asthma 02/12/2022   Kidney stones 09/17/2021   Lumbar back pain 10/05/2019   Left hip pain 10/05/2019   Cigarette smoker 02/24/2018     Physical Exam  Triage Vital Signs: ED Triage Vitals  Enc Vitals Group     BP 03/10/22 1732 (!) 160/96     Pulse Rate 03/10/22 1732 (!) 124     Resp 03/10/22 1732 18     Temp 03/10/22 1732 99.8 F (37.7 C)     Temp Source 03/10/22 1732 Oral     SpO2 03/10/22 1732 93 %     Weight 03/10/22 1734 174 lb (78.9 kg)     Height --      Head Circumference --      Peak Flow --      Pain Score 03/10/22 1734 0     Pain Loc --      Pain Edu? --      Excl. in GC? --     Most recent vital signs: Vitals:   03/10/22 2044 03/10/22 2046  BP:    Pulse: (!) 130 (!) 126  Resp: (!) 32 (!) 28  Temp:    SpO2: 92% 95%     General: Awake, no distress.  CV:  Good peripheral perfusion. + Tachycardia Resp:  Patient is tachypneic, biphasic wheezing with somewhat decreased air movement Abd:  No distention. Neuro:             Awake, Alert, Oriented x 3  Other:  Edema or limb asymmetry   ED Results / Procedures / Treatments  Labs (all labs ordered are listed, but only abnormal results  are displayed) Labs Reviewed  COMPREHENSIVE METABOLIC PANEL - Abnormal; Notable for the following components:      Result Value   Glucose, Bld 105 (*)    All other components within normal limits  SARS CORONAVIRUS 2 BY RT PCR  CBC  TROPONIN I (HIGH SENSITIVITY)  TROPONIN I (HIGH SENSITIVITY)     EKG  EKG shows sinus tach normal axis normal intervals no acute ischemic changes   RADIOLOGY I reviewed and interpreted the CXR which does not show any acute cardiopulmonary process    PROCEDURES:  Critical Care performed: Yes, see critical care procedure note(s)  .Critical Care  Performed by: Georga Hacking, MD Authorized by: Georga Hacking, MD   Critical care provider statement:    Critical care time (minutes):  30   Critical care was time spent personally by me on the following activities:  Development of treatment plan with patient or surrogate, discussions with  consultants, evaluation of patient's response to treatment, examination of patient, ordering and review of laboratory studies, ordering and review of radiographic studies, ordering and performing treatments and interventions, pulse oximetry, re-evaluation of patient's condition and review of old charts   The patient is on the cardiac monitor to evaluate for evidence of arrhythmia and/or significant heart rate changes.   MEDICATIONS ORDERED IN ED: Medications  albuterol (VENTOLIN HFA) 108 (90 Base) MCG/ACT inhaler 2 puff (has no administration in time range)  magnesium sulfate IVPB 2 g 50 mL (2 g Intravenous New Bag/Given 03/10/22 2044)  ipratropium-albuterol (DUONEB) 0.5-2.5 (3) MG/3ML nebulizer solution 3 mL (has no administration in time range)  methylPREDNISolone sodium succinate (SOLU-MEDROL) 125 mg/2 mL injection 125 mg (125 mg Intravenous Given 03/10/22 1749)  albuterol (PROVENTIL) (2.5 MG/3ML) 0.083% nebulizer solution 5 mg (5 mg Nebulization Given 03/10/22 1750)  ipratropium-albuterol (DUONEB) 0.5-2.5  (3) MG/3ML nebulizer solution 3 mL (3 mLs Nebulization Given 03/10/22 2019)  ipratropium-albuterol (DUONEB) 0.5-2.5 (3) MG/3ML nebulizer solution 3 mL (3 mLs Nebulization Given 03/10/22 2019)  lactated ringers bolus 1,000 mL (1,000 mLs Intravenous New Bag/Given 03/10/22 2024)     IMPRESSION / MDM / ASSESSMENT AND PLAN / ED COURSE  I reviewed the triage vital signs and the nursing notes.                              Patient's presentation is most consistent with acute presentation with potential threat to life or bodily function.  Differential diagnosis includes, but is not limited to, asthma exacerbation, pneumonia, less likely pulmonary embolism  The patient is a 61 year old male with a history of asthma never had to be admitted or intubated for it who presents today because of shortness of breath x3 to 4 days.  On arrival patient is tachycardic tachypneic.  Given DuoNeb and Solu-Medrol in triage.  On my assessment he is tachypneic but is able to speak in full sentences does have biphasic wheezing with decreased air movement.  Does not appear volume overloaded and complains of some back pain with deep breathing and coughing that is productive of clear sputum no fevers or chills.  His chest x-ray is clear EKG shows sinus tach.  Labs overall reassuring including negative troponin x2.  Patient was given magnesium additional DuoNebs.  On repeat he is now requiring 3 L of oxygen and continues to have biphasic wheezing although is looking much more comfortable.  This point do not feel that he will be able to be adequately turnaround in the ED for discharge.  Will admit to the hospitalist service.     FINAL CLINICAL IMPRESSION(S) / ED DIAGNOSES   Final diagnoses:  Exacerbation of asthma, unspecified asthma severity, unspecified whether persistent     Rx / DC Orders   ED Discharge Orders     None        Note:  This document was prepared using Dragon voice recognition software and may  include unintentional dictation errors.   Rada Hay, MD 03/10/22 2115

## 2022-03-10 NOTE — Assessment & Plan Note (Addendum)
Presumed secondary to allergies to rabbit, CTA also concerning of bronchitis, some bronchiectasis changes and few groundglass opacities. Patient is a current smoker-need outpatient pulmonary evaluation to rule out COPD or any other underlying lung pathology. Also need repeat CT chest in 4 to 6 weeks. -Continue with steroid -Add Zithromax -DuoNeb every 4 hourly -As needed bronchodilators -Supplemental oxygen- keep saturation above 90% -Check respiratory viral panel

## 2022-03-10 NOTE — Assessment & Plan Note (Signed)
-   Presumed secondary to asthma exacerbation.  No baseline oxygen use, currently requiring up to 4 L of oxygen. -Continue with supplemental oxygen-wean as tolerated

## 2022-03-10 NOTE — ED Provider Triage Note (Signed)
Emergency Medicine Provider Triage Evaluation Note  Philip [name] , a 61 y.o. male  was evaluated in triage.  Pt complains of shortness of breath. HX asthma, symptoms 3-4 days after influenza vaccine. Wheezing and using treatments at home with no improvement.  Review of Systems  Positive: SHOB, wheezing Negative: Fever, chills, cough, GI  Physical Exam  BP (!) 160/96   Pulse (!) 124   Temp 99.8 F (37.7 C) (Oral)   Resp 18   Wt 78.9 kg   SpO2 93%   BMI 27.25 kg/m  Gen:   Awake, no distress   Resp:  Normal effort but significant wheezing MSK:   Moves extremities without difficulty  Other:    Medical Decision Making  Medically screening exam initiated at 6:16 PM.  Appropriate orders placed.  Philip [name] was informed that the remainder of the evaluation will be completed by another provider, this initial triage assessment does not replace that evaluation, and the importance of remaining in the ED until their evaluation is complete.  Neb and solumedrol in triage. Basic labs and xray ordered   Darletta Moll, PA-C 03/10/22 1816

## 2022-03-10 NOTE — Assessment & Plan Note (Signed)
-   As needed nicotine patch ordered -Counseling was provided

## 2022-03-10 NOTE — H&P (Signed)
History and Physical   Philip [name] [id number] DOB: [birth year] DOA: 03/10/2022  PCP: McLean-Scocuzza, Nino Glow, MD  Outpatient Specialists: Dr. Patsey Berthold, pulmonology Patient coming from: Home  I have personally briefly reviewed patient's old medical records in [place].  Chief Concern: Shortness of breath  HPI: Philip [name] is a 61 year old male with history of asthma who presents emergency department for chief concerns of shortness of breath.  Initial vitals in the emergency department showed temperature of 99.8, respiration rate 18, heart rate of 124, blood pressure 160/96, SPO2 of 93% on room air.  Serum sodium is 139, potassium 4.0, chloride 106, bicarb 28, BUN of 13, serum creatinine of 1.08, nonfasting blood glucose 105, GFR greater than 60, WBC 5.7, hemoglobin 15.5, platelets of 225.  High sensitive troponin is 8 and on repeat is 8.  ED treatment: DuoNeb's x2 treatments, albuterol nebulizer one-time, Solu-Medrol 125 mg IV one-time dose, LR 1 L bolus, magnesium 2 g IV.  At bedside, he is able to tell me his name, age, current year. He reports the shortness of breath that started about two days. He endorse cough with white mucus. He endorses chest pain with coughing.   He reports similar symptoms in April 2023 at [facility] however, this time it is worse.   Patient denies known chemical/solvents changes work.  Of note, patient recently discovered through allergy testing with his pulmonologist that he is allergic to rabbits.  They coincidentally have a rabbit at home as a pet via his young son.  Patient also endorses bilateral lower extremity leg pain.  Social history: He lives with his wife and son at home. He smokes 1/2 ppd. He denies etoh and recreational drug use. He environmental services.   ROS: Constitutional: no weight change, no fever ENT/Mouth: no sore throat, no rhinorrhea Eyes: no eye pain, no vision changes Cardiovascular: no chest pain, +  dyspnea,  no edema, no palpitations Respiratory: no cough, no sputum, + wheezing Gastrointestinal: no nausea, no vomiting, no diarrhea, no constipation Genitourinary: no urinary incontinence, no dysuria, no hematuria Musculoskeletal: no arthralgias, no myalgias Skin: no skin lesions, no pruritus, Neuro: no weakness, no loss of consciousness, no syncope Psych: no anxiety, no depression, no decrease appetite Heme/Lymph: no bruising, no bleeding  ED Course: Discussed with emergency medicine provider, patient requiring hospitalization for chief concerns of asthma exacerbation  Assessment/Plan  Principal Problem:   Asthma exacerbation Active Problems:   Cigarette smoker   Sinus tachycardia   Acute hypoxic respiratory failure (HCC)   Bilateral leg pain   Assessment and Plan:  * Asthma exacerbation Presumed secondary to allergies to rabbit - DuoNebs 3 times daily, 4 doses ordered - Solu-Medrol 40 mg IV every 12 hours, 1 day ordered with tapering to oral prednisone  Bilateral leg pain - Ultrasound of the lower extremity ordered to assess for DVT  Acute hypoxic respiratory failure (HCC) - Presumed secondary to asthma exacerbation  Sinus tachycardia - Presumed secondary to asthma exacerbation - Has not improved with DuoNebs and Solu-Medrol - CTA chest has been ordered to assess for PE - Metoprolol 5 mg IV every hour as needed for heart rate greater than 120, 4 doses ordered  Cigarette smoker - As needed nicotine patch ordered  Counseling given on finding another home for said rabbit  Chart reviewed.   DVT prophylaxis: Enoxaparin Code Status: Full code Diet: Heart healthy Family Communication: Updated spouse and son at bedside with patient's permission Disposition Plan: Pending clinical course Consults called: None at  this time Admission status: Telemetry medical, observation  Past Medical History:  Diagnosis Date   COVID-19    05/15/20   Smoker    History reviewed.  No pertinent surgical history.  Social History:  reports that he has been smoking cigarettes. He has a 15.00 pack-year smoking history. He has never used smokeless tobacco. He reports that he does not drink alcohol and does not use drugs.  No Known Allergies Family History  Problem Relation Age of Onset   Early death Mother    Family history: Family history reviewed and not pertinent  Prior to Admission medications   Medication Sig Start Date End Date Taking? Authorizing Provider  albuterol (VENTOLIN HFA) 108 (90 Base) MCG/ACT inhaler Inhale 1-2 puffs into the lungs every 6 (six) hours as needed for wheezing or shortness of breath. 09/17/21   McLean-Scocuzza, Nino Glow, MD  Fluticasone-Umeclidin-Vilant (TRELEGY ELLIPTA) 200-62.5-25 MCG/ACT AEPB Inhale 1 puff into the lungs daily. 02/12/22   Tyler Pita, MD  Fluticasone-Umeclidin-Vilant (TRELEGY ELLIPTA) 200-62.5-25 MCG/ACT AEPB Inhale 1 puff into the lungs daily. 02/12/22   Tyler Pita, MD   Physical Exam: Vitals:   03/10/22 2046 03/10/22 2139 03/10/22 2144 03/10/22 2251  BP:   (!) 148/92 (!) 122/94  Pulse: (!) 126  (!) 125 (!) 130  Resp: (!) 28  20 20   Temp:  99.2 F (37.3 C)  98.4 F (36.9 C)  TempSrc:  Oral  Oral  SpO2: 95%  94% 96%  Weight:    78.9 kg  Height:    5\' 7"  (1.702 m)   Constitutional: appears age-appropriate, NAD, calm, comfortable Eyes: PERRL, lids and conjunctivae normal ENMT: Mucous membranes are moist. Posterior pharynx clear of any exudate or lesions. Age-appropriate dentition. Hearing appropriate Neck: normal, supple, no masses, no thyromegaly Respiratory: clear to auscultation bilaterally, + diffuse bilateral wheezing, no crackles. Normal respiratory effort. No accessory muscle use.  Cardiovascular: Regular rate and rhythm, no murmurs / rubs / gallops. No extremity edema. 2+ pedal pulses. No carotid bruits.  Abdomen: no tenderness, no masses palpated, no hepatosplenomegaly. Bowel sounds positive.   Musculoskeletal: no clubbing / cyanosis. No joint deformity upper and lower extremities. Good ROM, no contractures, no atrophy. Normal muscle tone.  Skin: no rashes, lesions, ulcers. No induration Neurologic: Sensation intact. Strength 5/5 in all 4.  Psychiatric: Normal judgment and insight. Alert and oriented x 3. Normal mood.   EKG: independently reviewed, showing sinus tachycardia with rate of 125, QTc 427  Chest x-ray on Admission: I personally reviewed and I agree with radiologist reading as below.  DG Chest 2 View  Result Date: 03/10/2022 CLINICAL DATA:  Shortness of breath EXAM: CHEST - 2 VIEW COMPARISON:  None Available. FINDINGS: No pleural effusion. No pneumothorax. Normal cardiac and mediastinal contours. No focal airspace opacity. No displaced rib fractures. Visualized upper abdomen is unremarkable. Vertebral body heights are maintained. Degenerative changes at the bilateral AC joints. IMPRESSION: No focal airspace opacity Electronically Signed   By: Marin Roberts M.D.   On: 03/10/2022 18:24    Labs on Admission: I have personally reviewed following labs  CBC: Recent Labs  Lab 03/10/22 1743  WBC 5.7  HGB 15.5  HCT 45.6  MCV 88.2  PLT 161   Basic Metabolic Panel: Recent Labs  Lab 03/10/22 1743  NA 139  K 4.0  CL 106  CO2 28  GLUCOSE 105*  BUN 13  CREATININE 1.08  CALCIUM 9.5   GFR: Estimated Creatinine Clearance: 67.2 mL/min (by C-G  formula based on SCr of 1.08 mg/dL).  Liver Function Tests: Recent Labs  Lab 03/10/22 1743  AST 33  ALT 27  ALKPHOS 68  BILITOT 0.6  PROT 7.8  ALBUMIN 4.1   Urine analysis:    Component Value Date/Time   COLORURINE YELLOW 09/17/2021 1019   APPEARANCEUR CLEAR 09/17/2021 1019   LABSPEC 1.014 09/17/2021 1019   PHURINE 6.0 09/17/2021 1019   GLUCOSEU NEGATIVE 09/17/2021 1019   GLUCOSEU NEGATIVE 10/05/2019 [place] 09/17/2021 [place] 10/05/2019 1156   BILIRUBINUR neg 02/24/2018 0926    KETONESUR NEGATIVE 09/17/2021 1019   PROTEINUR NEGATIVE 09/17/2021 1019   UROBILINOGEN 0.2 10/05/2019 1156   NITRITE NEGATIVE 09/17/2021 1019   LEUKOCYTESUR NEGATIVE 09/17/2021 1019   Dr. Tobie Poet [facility]  If 7PM-7AM, please contact overnight-coverage provider If 7AM-7PM, please contact day coverage provider [email or web address]  03/10/2022, 11:42 PM

## 2022-03-10 NOTE — Assessment & Plan Note (Signed)
-   Ultrasound of the lower extremity was negative for PE

## 2022-03-10 NOTE — Assessment & Plan Note (Addendum)
-   Presumed secondary to asthma exacerbation. -CTA negative for PE - Metoprolol 5 mg IV every hour as needed for heart rate greater than 120, 4 doses ordered

## 2022-03-10 NOTE — Hospital Course (Signed)
Mr. Philip [name] is a 61 year old male with history of asthma who presents emergency department for chief concerns of shortness of breath.  Initial vitals in the emergency department showed temperature of 99.8, respiration rate 18, heart rate of 124, blood pressure 160/96, SPO2 of 93% on room air.  Serum sodium is 139, potassium 4.0, chloride 106, bicarb 28, BUN of 13, serum creatinine of 1.08, nonfasting blood glucose 105, GFR greater than 60, WBC 5.7, hemoglobin 15.5, platelets of 225.  High sensitive troponin is 8 and on repeat is 8.  ED treatment: DuoNeb's x2 treatments, albuterol nebulizer one-time, Solu-Medrol 125 mg IV one-time dose, LR 1 L bolus, magnesium 2 g IV.

## 2022-03-11 ENCOUNTER — Observation Stay: Payer: 59

## 2022-03-11 DIAGNOSIS — J479 Bronchiectasis, uncomplicated: Secondary | ICD-10-CM | POA: Diagnosis present

## 2022-03-11 DIAGNOSIS — R000 Tachycardia, unspecified: Secondary | ICD-10-CM | POA: Diagnosis present

## 2022-03-11 DIAGNOSIS — Z1152 Encounter for screening for COVID-19: Secondary | ICD-10-CM | POA: Diagnosis not present

## 2022-03-11 DIAGNOSIS — M79605 Pain in left leg: Secondary | ICD-10-CM | POA: Diagnosis present

## 2022-03-11 DIAGNOSIS — Z8616 Personal history of COVID-19: Secondary | ICD-10-CM | POA: Diagnosis not present

## 2022-03-11 DIAGNOSIS — J9601 Acute respiratory failure with hypoxia: Secondary | ICD-10-CM | POA: Diagnosis present

## 2022-03-11 DIAGNOSIS — F17210 Nicotine dependence, cigarettes, uncomplicated: Secondary | ICD-10-CM | POA: Diagnosis present

## 2022-03-11 DIAGNOSIS — B9789 Other viral agents as the cause of diseases classified elsewhere: Secondary | ICD-10-CM | POA: Diagnosis present

## 2022-03-11 DIAGNOSIS — J45901 Unspecified asthma with (acute) exacerbation: Secondary | ICD-10-CM | POA: Diagnosis present

## 2022-03-11 DIAGNOSIS — M79604 Pain in right leg: Secondary | ICD-10-CM | POA: Diagnosis present

## 2022-03-11 LAB — RESPIRATORY PANEL BY PCR

## 2022-03-11 LAB — CBC
HCT: 44.1 % (ref 39.0–52.0)
Hemoglobin: 14.9 g/dL (ref 13.0–17.0)
MCH: 30 pg (ref 26.0–34.0)
MCHC: 33.8 g/dL (ref 30.0–36.0)
MCV: 88.9 fL (ref 80.0–100.0)
Platelets: 218 10*3/uL (ref 150–400)
RBC: 4.96 MIL/uL (ref 4.22–5.81)
RDW: 13 % (ref 11.5–15.5)
WBC: 4.8 10*3/uL (ref 4.0–10.5)
nRBC: 0 % (ref 0.0–0.2)

## 2022-03-11 LAB — BASIC METABOLIC PANEL
Anion gap: 8 (ref 5–15)
BUN: 14 mg/dL (ref 8–23)
CO2: 26 mmol/L (ref 22–32)
Calcium: 9.2 mg/dL (ref 8.9–10.3)
Chloride: 108 mmol/L (ref 98–111)
Creatinine, Ser: 0.87 mg/dL (ref 0.61–1.24)
GFR, Estimated: >60 mL/min (ref >60)
Glucose, Bld: 115 mg/dL — ABNORMAL HIGH (ref 70–99)
Potassium: 4.5 mmol/L (ref 3.5–5.1)
Sodium: 142 mmol/L (ref 135–145)

## 2022-03-11 LAB — HIV ANTIBODY (ROUTINE TESTING W REFLEX): HIV Screen 4th Generation wRfx: NONREACTIVE

## 2022-03-11 LAB — MAGNESIUM: Magnesium: 2.4 mg/dL (ref 1.7–2.4)

## 2022-03-11 MED ORDER — AZITHROMYCIN 250 MG PO TABS
250.0000 mg | ORAL_TABLET | Freq: Every day | ORAL | Status: DC
  Administered 2022-03-12: 250 mg via ORAL
  Filled 2022-03-11: qty 1

## 2022-03-11 MED ORDER — AZITHROMYCIN 250 MG PO TABS: 250.0000 mg | ORAL_TABLET | Freq: Every day | ORAL | Status: DC

## 2022-03-11 MED ORDER — AZITHROMYCIN 250 MG PO TABS
500.0000 mg | ORAL_TABLET | Freq: Every day | ORAL | Status: AC
  Administered 2022-03-11: 500 mg via ORAL
  Filled 2022-03-11: qty 2

## 2022-03-11 MED ORDER — IPRATROPIUM-ALBUTEROL 0.5-2.5 (3) MG/3ML IN SOLN
3.0000 mL | RESPIRATORY_TRACT | Status: DC
  Administered 2022-03-11 – 2022-03-12 (×4): 3 mL via RESPIRATORY_TRACT
  Filled 2022-03-11 (×4): qty 3

## 2022-03-11 NOTE — Progress Notes (Signed)
Progress Note   Patient: Philip [name] ZHG:[id number] DOB: 1960-07-18 DOA: 03/10/2022     0 DOS: the patient was seen and examined on 03/11/2022   Brief hospital course: Mr. Philip [name] is a 61 year old male with history of asthma who presents emergency department for chief concerns of shortness of breath.  Initial vitals in the emergency department showed temperature of 99.8, respiration rate 18, heart rate of 124, blood pressure 160/96, SPO2 of 93% on room air.  Serum sodium is 139, potassium 4.0, chloride 106, bicarb 28, BUN of 13, serum creatinine of 1.08, nonfasting blood glucose 105, GFR greater than 60, WBC 5.7, hemoglobin 15.5, platelets of 225.  COVID PCR negative  High sensitive troponin is 8 and on repeat is 8.  ED treatment: DuoNeb's x2 treatments, albuterol nebulizer one-time, Solu-Medrol 125 mg IV one-time dose, LR 1 L bolus, magnesium 2 g IV.  10/31: Patient remained tachycardic and tachypneic, and quiring up to 4 L of oxygen.  Continued to have wheezing.  Lower extremity venous Doppler was negative for DVT. CTA chest was negative for PE but did show signs of bronchitis with scattered small bronchiolar mucus impaction in all lobes and mild cylindrical bronchiectasis in the lower lung field. Also noted groundglass opacity anteriorly in the right lung apex which may suggest pneumonitis or chronic changes.  Short interval follow-up CT is recommended Also noted to have small right posterolateral trachea he had diverticulum. Patient should follow-up with the pulmonologist as an outpatient to rule out underlying lung pathology.  He is a current smoker   Assessment and Plan: * Acute hypoxic respiratory failure ([facility]) - Presumed secondary to asthma exacerbation.  No baseline oxygen use, currently requiring up to 4 L of oxygen. -Continue with supplemental oxygen-wean as tolerated  Asthma exacerbation Presumed secondary to allergies to rabbit, CTA also concerning of bronchitis,  some bronchiectasis changes and few groundglass opacities. Patient is a current smoker-need outpatient pulmonary evaluation to rule out COPD or any other underlying lung pathology. Also need repeat CT chest in 4 to 6 weeks. -Continue with steroid -Add Zithromax -DuoNeb every 4 hourly -As needed bronchodilators -Supplemental oxygen- keep saturation above 90% -Check respiratory viral panel  Sinus tachycardia - Presumed secondary to asthma exacerbation. -CTA negative for PE - Metoprolol 5 mg IV every hour as needed for heart rate greater than 120, 4 doses ordered  Cigarette smoker - As needed nicotine patch ordered -Counseling was provided  Bilateral leg pain - Ultrasound of the lower extremity was negative for PE   Subjective: Patient continued to feel short of breath and cough.  Current smoker.  Physical Exam: Vitals:   03/11/22 0419 03/11/22 0903 03/11/22 1200 03/11/22 1451  BP: 135/76 (!) 153/91 (!) 142/88 (!) 149/97  Pulse: (!) 108 (!) 120 (!) 110 (!) 104  Resp: 20 (!) 22 20 16   Temp: 98.5 F (36.9 C) 98.3 F (36.8 C) 98.6 F (37 C) 98.2 F (36.8 C)  TempSrc: Oral     SpO2: 98% 94% 92% 99%  Weight:      Height:       General.  Well-developed gentleman in no acute distress. Pulmonary.  Bilateral scattered wheeze with mildly increased work of breathing CV.  Sinus tachycardia Abdomen.  Soft, nontender, nondistended, BS positive. CNS.  Alert and oriented .  No focal neurologic deficit. Extremities.  No edema, no cyanosis, pulses intact and symmetrical. Psychiatry.  Judgment and insight appears normal.  Data Reviewed: Prior data reviewed  Family Communication: Discussed with patient  Disposition: Status is: Inpatient Remains inpatient appropriate because: Severity of illness   Planned Discharge Destination: Home  DVT prophylaxis.  Lovenox Time spent: 45 minutes  This record has been created using Conservation officer, historic buildings. Errors have been sought  and corrected,but may not always be located. Such creation errors do not reflect on the standard of care.  Author: Arnetha Courser, MD 03/11/2022 4:43 PM  For on call review [email or web address].

## 2022-03-12 DIAGNOSIS — J9601 Acute respiratory failure with hypoxia: Secondary | ICD-10-CM | POA: Diagnosis not present

## 2022-03-12 MED ORDER — IPRATROPIUM-ALBUTEROL 0.5-2.5 (3) MG/3ML IN SOLN
3.0000 mL | Freq: Four times a day (QID) | RESPIRATORY_TRACT | Status: DC
  Administered 2022-03-12 (×2): 3 mL via RESPIRATORY_TRACT
  Filled 2022-03-12 (×2): qty 3

## 2022-03-12 MED ORDER — IPRATROPIUM-ALBUTEROL 0.5-2.5 (3) MG/3ML IN SOLN
3.0000 mL | Freq: Three times a day (TID) | RESPIRATORY_TRACT | Status: DC
  Administered 2022-03-13: 3 mL via RESPIRATORY_TRACT
  Filled 2022-03-12: qty 3

## 2022-03-12 NOTE — Progress Notes (Signed)
Patient ambulated  independently in hallway 3 laps around the nursing station on room air  oxygen sats ranged from 94-97%.

## 2022-03-12 NOTE — Assessment & Plan Note (Signed)
Presumed secondary to allergies to rabbit, CTA also concerning of bronchitis, some bronchiectasis changes and few groundglass opacities. Patient is a current smoker-need outpatient pulmonary evaluation to rule out COPD or any other underlying lung pathology. Also need repeat CT chest in 4 to 6 weeks. -Continue with steroid -Add Zithromax -DuoNeb every 4 hourly -As needed bronchodilators -Supplemental oxygen- keep saturation above 90% -Respiratory viral panel positive for rhinovirus.

## 2022-03-12 NOTE — Assessment & Plan Note (Signed)
-   Presumed secondary to asthma exacerbation.  No baseline oxygen use,  Able to wean off to room air at rest. -Ambulate with pulse ox

## 2022-03-12 NOTE — Progress Notes (Signed)
Progress Note   Patient: Philip [name] TDV:[id number] DOB: 1960-11-17 DOA: 03/10/2022     1 DOS: the patient was seen and examined on 03/12/2022   Brief hospital course: Mr. Philip [name] is a 61 year old male with history of asthma who presents emergency department for chief concerns of shortness of breath.  Initial vitals in the emergency department showed temperature of 99.8, respiration rate 18, heart rate of 124, blood pressure 160/96, SPO2 of 93% on room air.  Serum sodium is 139, potassium 4.0, chloride 106, bicarb 28, BUN of 13, serum creatinine of 1.08, nonfasting blood glucose 105, GFR greater than 60, WBC 5.7, hemoglobin 15.5, platelets of 225.  COVID PCR negative  High sensitive troponin is 8 and on repeat is 8.  ED treatment: DuoNeb's x2 treatments, albuterol nebulizer one-time, Solu-Medrol 125 mg IV one-time dose, LR 1 L bolus, magnesium 2 g IV.  10/31: Patient remained tachycardic and tachypneic, and quiring up to 4 L of oxygen.  Continued to have wheezing.  Lower extremity venous Doppler was negative for DVT. CTA chest was negative for PE but did show signs of bronchitis with scattered small bronchiolar mucus impaction in all lobes and mild cylindrical bronchiectasis in the lower lung field. Also noted groundglass opacity anteriorly in the right lung apex which may suggest pneumonitis or chronic changes.  Short interval follow-up CT is recommended Also noted to have small right posterolateral trachea he had diverticulum. Patient should follow-up with the pulmonologist as an outpatient to rule out underlying lung pathology.  He is a current smoker.  11/1: Patient continued to have significant wheeze.  Able to wean off to room air at rest. Patient need to be ambulated with pulse ox.  Respiratory viral panel positive for rhinovirus If continue to improve-then discharge tomorrow   Assessment and Plan: * Acute hypoxic respiratory failure (HCC) - Presumed secondary to asthma  exacerbation.  No baseline oxygen use,  Able to wean off to room air at rest. -Ambulate with pulse ox  Asthma exacerbation Presumed secondary to allergies to rabbit, CTA also concerning of bronchitis, some bronchiectasis changes and few groundglass opacities. Patient is a current smoker-need outpatient pulmonary evaluation to rule out COPD or any other underlying lung pathology. Also need repeat CT chest in 4 to 6 weeks. -Continue with steroid -Add Zithromax -DuoNeb every 4 hourly -As needed bronchodilators -Supplemental oxygen- keep saturation above 90% -Respiratory viral panel positive for rhinovirus.  Sinus tachycardia - Presumed secondary to asthma exacerbation. -CTA negative for PE - Metoprolol 5 mg IV every hour as needed for heart rate greater than 120, 4 doses ordered  Cigarette smoker - As needed nicotine patch ordered -Counseling was provided  Bilateral leg pain - Ultrasound of the lower extremity was negative for PE   Subjective: Patient is feeling little improved when seen today.  Continued to have some wheeze, and cough.  Physical Exam: Vitals:   03/12/22 1030 03/12/22 1100 03/12/22 1232 03/12/22 1520  BP:    133/75  Pulse:    (!) 103  Resp:    19  Temp:    98.2 F (36.8 C)  TempSrc:    Oral  SpO2: 97% 96% 96% 95%  Weight:      Height:       General.  Well-developed gentleman, in no acute distress. Pulmonary.  Scattered wheeze bilaterally, normal respiratory effort. CV.  Regular rate and rhythm, no JVD, rub or murmur. Abdomen.  Soft, nontender, nondistended, BS positive. CNS.  Alert and oriented .  No focal neurologic deficit. Extremities.  No edema, no cyanosis, pulses intact and symmetrical. Psychiatry.  Judgment and insight appears normal.   Data Reviewed: Prior data reviewed  Family Communication: Discussed with patient  Disposition: Status is: Inpatient Remains inpatient appropriate because: Severity of illness   Planned Discharge  Destination: Home  DVT prophylaxis.  Lovenox Time spent: 43 minutes  This record has been created using Systems analyst. Errors have been sought and corrected,but may not always be located. Such creation errors do not reflect on the standard of care.  Author: Lorella Nimrod, MD 03/12/2022 4:46 PM  For on call review [email or web address].

## 2022-03-13 ENCOUNTER — Other Ambulatory Visit: Payer: Self-pay

## 2022-03-13 DIAGNOSIS — J9601 Acute respiratory failure with hypoxia: Secondary | ICD-10-CM | POA: Diagnosis not present

## 2022-03-13 MED ORDER — PREDNISONE 20 MG PO TABS: 40.0000 mg | ORAL_TABLET | Freq: Every day | ORAL | 0 refills | Status: AC

## 2022-03-13 MED ORDER — AZITHROMYCIN 250 MG PO TABS: ORAL_TABLET | ORAL | 0 refills | Status: DC

## 2022-03-13 MED ORDER — NICOTINE 14 MG/24HR TD PT24: 14.0000 mg | MEDICATED_PATCH | Freq: Every day | TRANSDERMAL | 0 refills | Status: DC | PRN

## 2022-03-13 NOTE — Discharge Summary (Signed)
Physician Discharge Summary   Patient: Philip [name] MRN: [id number] DOB: March 06, 1961  Admit date:     03/10/2022  Discharge date: 03/13/22  Discharge Physician: Arnetha Courser   PCP: McLean-Scocuzza, Pasty Spillers, MD   Recommendations at discharge:  Please obtain CBC and BMP in 1 week Follow-up with primary care provider within a week Follow-up with pulmonologist  Discharge Diagnoses: Principal Problem:   Acute hypoxic respiratory failure (HCC) Active Problems:   Asthma exacerbation   Sinus tachycardia   Cigarette smoker   Bilateral leg pain   Hospital Course: Mr. Philip [name] is a 61 year old male with history of asthma who presents emergency department for chief concerns of shortness of breath.  Initial vitals in the emergency department showed temperature of 99.8, respiration rate 18, heart rate of 124, blood pressure 160/96, SPO2 of 93% on room air.  Serum sodium is 139, potassium 4.0, chloride 106, bicarb 28, BUN of 13, serum creatinine of 1.08, nonfasting blood glucose 105, GFR greater than 60, WBC 5.7, hemoglobin 15.5, platelets of 225.  COVID PCR negative  High sensitive troponin is 8 and on repeat is 8.  ED treatment: DuoNeb's x2 treatments, albuterol nebulizer one-time, Solu-Medrol 125 mg IV one-time dose, LR 1 L bolus, magnesium 2 g IV.  10/31: Patient remained tachycardic and tachypneic, and quiring up to 4 L of oxygen.  Continued to have wheezing.  Lower extremity venous Doppler was negative for DVT. CTA chest was negative for PE but did show signs of bronchitis with scattered small bronchiolar mucus impaction in all lobes and mild cylindrical bronchiectasis in the lower lung field. Also noted groundglass opacity anteriorly in the right lung apex which may suggest pneumonitis or chronic changes.  Short interval follow-up CT is recommended Also noted to have small right posterolateral trachea he had diverticulum. Patient should follow-up with the pulmonologist as an  outpatient to rule out underlying lung pathology.  He is a current smoker.  11/1: Patient continued to have significant wheeze.  Able to wean off to room air at rest. Patient need to be ambulated with pulse ox.  Respiratory viral panel positive for rhinovirus If continue to improve-then discharge tomorrow.  11/2: Patient remained stable.  Able to ambulate without any significant hypoxia.  Wheezing much improved.  He is being discharged on 3 more days of Zithromax and prednisone. He was counseled again for smoking cessation. Patient need to have a follow-up with the pulmonologist for further diagnosis of his underlying lung condition, suspecting COPD.  He will continue on current medications and need to have a close follow-up with his providers.  Assessment and Plan: * Acute hypoxic respiratory failure (HCC) - Presumed secondary to asthma exacerbation.  No baseline oxygen use,  Able to wean off to room air at rest. -Ambulate with pulse ox  Asthma exacerbation Presumed secondary to allergies to rabbit, CTA also concerning of bronchitis, some bronchiectasis changes and few groundglass opacities. Patient is a current smoker-need outpatient pulmonary evaluation to rule out COPD or any other underlying lung pathology. Also need repeat CT chest in 4 to 6 weeks. -Continue with steroid -Add Zithromax -DuoNeb every 4 hourly -As needed bronchodilators -Supplemental oxygen- keep saturation above 90% -Respiratory viral panel positive for rhinovirus.  Sinus tachycardia - Presumed secondary to asthma exacerbation. -CTA negative for PE - Metoprolol 5 mg IV every hour as needed for heart rate greater than 120, 4 doses ordered  Cigarette smoker - As needed nicotine patch ordered -Counseling was provided  Bilateral leg pain -  Ultrasound of the lower extremity was negative for PE   Consultants: None Procedures performed: None Disposition: Home Diet recommendation:  Discharge Diet Orders  (From admission, onward)     Start     Ordered   03/13/22 0000  Diet - low sodium heart healthy        03/13/22 1134           Cardiac diet DISCHARGE MEDICATION: Allergies as of 03/13/2022   No Known Allergies      Medication List     TAKE these medications    albuterol 108 (90 Base) MCG/ACT inhaler Commonly known as: VENTOLIN HFA Inhale 1-2 puffs into the lungs every 6 (six) hours as needed for wheezing or shortness of breath.   azithromycin 250 MG tablet Commonly known as: ZITHROMAX Take 1 tablet daily for 3 days   ibuprofen 200 MG tablet Commonly known as: ADVIL Take 800 mg by mouth every 8 (eight) hours as needed for fever or moderate pain.   nicotine 14 mg/24hr patch Commonly known as: NICODERM CQ - dosed in mg/24 hours Place 1 patch (14 mg total) onto the skin daily as needed (nicotine craving).   predniSONE 20 MG tablet Commonly known as: DELTASONE Take 2 tablets (40 mg total) by mouth daily with breakfast for 3 days.   Trelegy Ellipta 200-62.5-25 MCG/ACT Aepb Generic drug: Fluticasone-Umeclidin-Vilant Inhale 1 puff into the lungs daily.        Follow-up Information     McLean-Scocuzza, Nino Glow, MD. Schedule an appointment as soon as possible for a visit in 1 week(s).   Specialty: Internal Medicine Contact information: [place] [address] [phone]                Discharge Exam: [place] [employer]   03/10/22 1734 03/10/22 2251  Weight: 78.9 kg 78.9 kg   General.     In no acute distress. Pulmonary.  Scant wheeze bilaterally, normal respiratory effort. CV.  Regular rate and rhythm, no JVD, rub or murmur. Abdomen.  Soft, nontender, nondistended, BS positive. CNS.  Alert and oriented .  No focal neurologic deficit. Extremities.  No edema, no cyanosis, pulses intact and symmetrical. Psychiatry.  Judgment and insight appears normal.   Condition at discharge: stable  The results of significant diagnostics from this  hospitalization (including imaging, microbiology, ancillary and laboratory) are listed below for reference.   Imaging Studies: US Venous Img Lower Bilateral (DVT)  Result Date: 03/11/2022 CLINICAL DATA:  [id number] with lower extremity swelling. Lower extremity pain. EXAM: BILATERAL LOWER EXTREMITY VENOUS DOPPLER ULTRASOUND TECHNIQUE: Gray-scale sonography with graded compression, as well as color Doppler and duplex ultrasound were performed to evaluate the lower extremity deep venous systems from the level of the common femoral vein and including the common femoral, femoral, profunda femoral, popliteal and calf veins including the posterior tibial, peroneal and gastrocnemius veins when visible. The superficial great saphenous vein was also interrogated. Spectral Doppler was utilized to evaluate flow at rest and with distal augmentation maneuvers in the common femoral, femoral and popliteal veins. COMPARISON:  None Available. FINDINGS: RIGHT LOWER EXTREMITY Common Femoral Vein: No evidence of thrombus. Normal compressibility, respiratory phasicity and response to augmentation. Saphenofemoral Junction: No evidence of thrombus. Normal compressibility and flow on color Doppler imaging. Profunda Femoral Vein: No evidence of thrombus. Normal compressibility and flow on color Doppler imaging. Femoral Vein: No evidence of thrombus. Normal compressibility, respiratory phasicity and response to augmentation. Popliteal Vein: No evidence of thrombus. Normal compressibility, respiratory phasicity  and response to augmentation. Calf Veins: No evidence of thrombus. Normal compressibility and flow on color Doppler imaging. Superficial Great Saphenous Vein: No evidence of thrombus. Normal compressibility. Venous Reflux:  None. Other Findings:  None. LEFT LOWER EXTREMITY Common Femoral Vein: No evidence of thrombus. Normal compressibility, respiratory phasicity and response to augmentation. Saphenofemoral Junction: No evidence of  thrombus. Normal compressibility and flow on color Doppler imaging. Profunda Femoral Vein: No evidence of thrombus. Normal compressibility and flow on color Doppler imaging. Femoral Vein: No evidence of thrombus. Normal compressibility, respiratory phasicity and response to augmentation. Popliteal Vein: No evidence of thrombus. Normal compressibility, respiratory phasicity and response to augmentation. Calf Veins: No evidence of thrombus. Normal compressibility and flow on color Doppler imaging. Superficial Great Saphenous Vein: No evidence of thrombus. Normal compressibility. Venous Reflux:  None. Other Findings:  None. IMPRESSION: No evidence of deep venous thrombosis in either lower extremity. Electronically Signed   By: Almira Bar M.D.   On: 03/11/2022 01:15   CT Angio Chest Pulmonary Embolism (PE) W or WO Contrast  Result Date: 03/11/2022 CLINICAL DATA:  Pulmonary embolism suspected.  High probability. EXAM: CT ANGIOGRAPHY CHEST WITH CONTRAST TECHNIQUE: Multidetector CT imaging of the chest was performed using the standard protocol during bolus administration of intravenous contrast. Multiplanar CT image reconstructions and MIPs were obtained to evaluate the vascular anatomy. RADIATION DOSE REDUCTION: This exam was performed according to the departmental dose-optimization program which includes automated exposure control, adjustment of the mA and/or kV according to patient size and/or use of iterative reconstruction technique. CONTRAST:  42mL OMNIPAQUE IOHEXOL 350 MG/ML SOLN COMPARISON:  PA and lateral chest today is the only chest prior. FINDINGS: Cardiovascular: There is no evidence of arterial dilatation or emboli. There is mild aortic atherosclerosis. The aorta and great vessels demonstrate no stenosis, aneurysm or dissection. The cardiac size is normal. There is no pericardial effusion. There are patchy calcifications in the proximal LAD coronary artery. The pulmonary veins are decompressed.  Mediastinum/Nodes: No enlarged mediastinal, hilar, or axillary lymph nodes. Thyroid gland, trachea, and esophagus demonstrate no significant findings. Scattered shotty subcentimeter in short axis mediastinal nodes are incidentally seen. No hiatal hernia. Lungs/Pleura: There is diffuse bronchial thickening with scattered small bronchiolar mucous impactions in the upper and lower lobes and peripheral right middle lobe. Mild cylindrical bronchiectasis noted in the lower lung fields in the infrahilar areas. Ground-glass opacities noted anteriorly in the right lung apex and may suggest pneumonitis or chronic changes. The remaining lungs are clear of focal infiltrates. No nodule is seen either side. There is no pleural effusion, thickening or pneumothorax. The main bronchi are patent. There is a small right posterolateral tracheal air diverticulum at T2. Upper Abdomen: No acute abnormality. Moderate abdominal aortic atherosclerosis. Musculoskeletal: Mild thoracic kyphosis. No acute or significant osseous findings. No focal chest wall abnormality. Review of the MIP images confirms the above findings. IMPRESSION: 1. No evidence of arterial dilatation or emboli. 2. Aortic and coronary artery atherosclerosis. 3. Bronchitis with scattered small bronchiolar mucous impactions in all lobes and mild cylindrical bronchiectasis in the lower lung fields. 4. Ground-glass opacity anteriorly in the right lung apex which may suggest pneumonitis or chronic changes. Short interval follow-up CT recommended to ensure clearing. 5. Small right posterolateral tracheal air diverticulum at T2. Aortic Atherosclerosis (ICD10-I70.0). Electronically Signed   By: Almira Bar M.D.   On: 03/11/2022 00:10   DG Chest 2 View  Result Date: 03/10/2022 CLINICAL DATA:  Shortness of breath EXAM: CHEST - 2 VIEW  COMPARISON:  None Available. FINDINGS: No pleural effusion. No pneumothorax. Normal cardiac and mediastinal contours. No focal airspace opacity.  No displaced rib fractures. Visualized upper abdomen is unremarkable. Vertebral body heights are maintained. Degenerative changes at the bilateral AC joints. IMPRESSION: No focal airspace opacity Electronically Signed   By: Lorenza CambridgeHemant  Desai M.D.   On: 03/10/2022 18:24    Microbiology: Results for orders placed or performed during the hospital encounter of 03/10/22  SARS Coronavirus 2 by RT PCR (hospital order, performed in [facility] hospital lab) *cepheid single result test* Anterior Nasal Swab     Status: None   Collection Time: 03/10/22  9:42 PM   Specimen: Anterior Nasal Swab  Result Value Ref Range Status   SARS Coronavirus 2 by RT PCR NEGATIVE NEGATIVE Final    Comment: (NOTE) SARS-CoV-2 target nucleic acids are NOT DETECTED.  The SARS-CoV-2 RNA is generally detectable in upper and lower respiratory specimens during the acute phase of infection. The lowest concentration of SARS-CoV-2 viral copies this assay can detect is 250 copies / mL. A negative result does not preclude SARS-CoV-2 infection and should not be used as the sole basis for treatment or other patient management decisions.  A negative result may occur with improper specimen collection / handling, submission of specimen other than nasopharyngeal swab, presence of viral mutation(s) within the areas targeted by this assay, and inadequate number of viral copies (<250 copies / mL). A negative result must be combined with clinical observations, patient history, and epidemiological information.  Fact Sheet for Patients:   RoadLapTop.co.zahttps://www.fda.gov/media/158405/download  Fact Sheet for Healthcare Providers: http://kim-miller.com/https://www.fda.gov/media/158404/download  This test is not yet approved or  cleared by the [place] FDA and has been authorized for detection and/or diagnosis of SARS-CoV-2 by FDA under an Emergency Use Authorization (EUA).  This EUA will remain in effect (meaning this test can be used) for the duration of the COVID-19  declaration under Section 564(b)(1) of the Act, 21 U.S.C. section 360bbb-3(b)(1), unless the authorization is terminated or revoked sooner.  Performed at [facility], [address] [place], [place] [address]   Respiratory (~20 pathogens) panel by PCR     Status: Abnormal   Collection Time: 03/11/22  5:46 PM   Specimen: Nasopharyngeal Swab; Respiratory  Result Value Ref Range Status   Adenovirus NOT DETECTED NOT DETECTED Final   Coronavirus 229E NOT DETECTED NOT DETECTED Final    Comment: (NOTE) The Coronavirus on the Respiratory Panel, DOES NOT test for the novel  Coronavirus (2019 nCoV)    Coronavirus HKU1 NOT DETECTED NOT DETECTED Final   Coronavirus NL63 NOT DETECTED NOT DETECTED Final   Coronavirus OC43 NOT DETECTED NOT DETECTED Final   Metapneumovirus NOT DETECTED NOT DETECTED Final   Rhinovirus / Enterovirus DETECTED (A) NOT DETECTED Final   Influenza A NOT DETECTED NOT DETECTED Final   Influenza B NOT DETECTED NOT DETECTED Final   Parainfluenza Virus 1 NOT DETECTED NOT DETECTED Final   Parainfluenza Virus 2 NOT DETECTED NOT DETECTED Final   Parainfluenza Virus 3 NOT DETECTED NOT DETECTED Final   Parainfluenza Virus 4 NOT DETECTED NOT DETECTED Final   Respiratory Syncytial Virus NOT DETECTED NOT DETECTED Final   Bordetella pertussis NOT DETECTED NOT DETECTED Final   Bordetella Parapertussis NOT DETECTED NOT DETECTED Final   Chlamydophila pneumoniae NOT DETECTED NOT DETECTED Final   Mycoplasma pneumoniae NOT DETECTED NOT DETECTED Final    Comment: Performed at [facility], [address] [place], [place] [address]  Labs: CBC: Recent Labs  Lab 03/10/22 1743 03/11/22 0453  WBC 5.7 4.8  HGB 15.5 14.9  HCT 45.6 44.1  MCV 88.2 88.9  PLT 225 218   Basic Metabolic Panel: Recent Labs  Lab 03/10/22 1743 03/11/22 0453  NA 139 142  K 4.0 4.5  CL 106 108  CO2 28 26  GLUCOSE 105* 115*  BUN 13 14  CREATININE 1.08 0.87  CALCIUM 9.5 9.2  MG  --   2.4   Liver Function Tests: Recent Labs  Lab 03/10/22 1743  AST 33  ALT 27  ALKPHOS 68  BILITOT 0.6  PROT 7.8  ALBUMIN 4.1   CBG: No results for input(s): "GLUCAP" in the last 168 hours.  Discharge time spent: greater than 30 minutes.  This record has been created using Conservation officer, historic buildings. Errors have been sought and corrected,but may not always be located. Such creation errors do not reflect on the standard of care.   Signed: Arnetha Courser, MD [facility] 03/13/2022

## 2022-03-13 NOTE — TOC Initial Note (Signed)
Transition of Care [facility]) - Initial/Assessment Note    Patient Details  Name: Philip [name] MRN: [id number] Date of Birth: April 06, 1961  Transition of [facility]) CM/SW Contact:    Beverly Sessions, RN Phone Number: 03/13/2022, 11:36 AM  Clinical Narrative:                  Transition of Care [facility]) Screening Note   Patient Details  Name: Philip [name] Date of Birth: [birth year]   Transition of Care [facility]) CM/SW Contact:    Beverly Sessions, RN Phone Number: 03/13/2022, 11:36 AM    Transition of Care Department [facility]) has reviewed patient and no TOC needs have been identified at this time. We will continue to monitor patient advancement through interdisciplinary progression rounds. If new patient transition needs arise, please place a TOC consult.          Patient Goals and CMS Choice        Expected Discharge Plan and Services           Expected Discharge Date: 03/13/22                                    Prior Living Arrangements/Services                       Activities of Daily Living Home Assistive Devices/Equipment: None ADL Screening (condition at time of admission) Patient's cognitive ability adequate to safely complete daily activities?: Yes Is the patient deaf or have difficulty hearing?: No Does the patient have difficulty seeing, even when wearing glasses/contacts?: No Does the patient have difficulty concentrating, remembering, or making decisions?: No Patient able to express need for assistance with ADLs?: Yes Does the patient have difficulty dressing or bathing?: No Independently performs ADLs?: Yes (appropriate for developmental age) Does the patient have difficulty walking or climbing stairs?: No Weakness of Legs: None Weakness of Arms/Hands: None  Permission Sought/Granted                  Emotional Assessment              Admission diagnosis:  Asthma exacerbation [J45.901] Exacerbation of asthma, unspecified  asthma severity, unspecified whether persistent [J45.901] Patient Active Problem List   Diagnosis Date Noted   Asthma exacerbation 03/10/2022   Sinus tachycardia 03/10/2022   Acute hypoxic respiratory failure ([place]) 03/10/2022   Bilateral leg pain 03/10/2022   Moderate to severe persistent allergic asthma 02/12/2022   Kidney stones 09/17/2021   Lumbar back pain 10/05/2019   Left hip pain 10/05/2019   Cigarette smoker 02/24/2018   PCP:  McLean-Scocuzza, Nino Glow, MD Pharmacy:   [facility] [address] Phone: [phone] Fax: [phone]     Social Determinants of Health (SDOH) Interventions    Readmission Risk Interventions     No data to display

## 2022-03-13 NOTE — Progress Notes (Signed)
Pt discharged per MD order. IV removed. Discharge instructions reviewed with pt. Pt verbalized understanding.  All questions answered to pt satisfaction.  

## 2022-03-17 ENCOUNTER — Encounter: Payer: Self-pay | Admitting: Family

## 2022-03-17 ENCOUNTER — Telehealth: Payer: Self-pay | Admitting: Internal Medicine

## 2022-03-17 ENCOUNTER — Ambulatory Visit (INDEPENDENT_AMBULATORY_CARE_PROVIDER_SITE_OTHER): Payer: 59 | Admitting: Family

## 2022-03-17 VITALS — BP 136/86 | HR 83 | Temp 98.5°F | Ht 67.0 in | Wt 170.4 lb

## 2022-03-17 DIAGNOSIS — J9601 Acute respiratory failure with hypoxia: Secondary | ICD-10-CM | POA: Diagnosis not present

## 2022-03-17 DIAGNOSIS — I700 Atherosclerosis of aorta: Secondary | ICD-10-CM

## 2022-03-17 NOTE — Progress Notes (Signed)
Subjective:    Patient ID: Philip [name], male    DOB: [birth year], 61 y.o.   MRN: [id number]  CC: Philip [name] is a 61 y.o. male who presents today for follow up.   HPI: Feeling better today Sob has resolved No cough today, however cough is occasional Using albuterol 1-2 /day.  He is using trelegy.  Completed zpak , prednisone.  He remains physically active at work with [employer].   Presented to emergency room 03/10/2022 for SOB, CP with coughing, leg pain, sinus tachycardia  allergy testing with his pulmonologist that he is allergic to rabbits.  They have a rabbit at home.  Former smoker. He is not using nicotine.   Hospital course significant for tachycardia, , tachypneic requiring up to 4 L of oxygen.  Wheezing. Bilateral Lower extremity venous Doppler negative for DVT.  CTA chest was negative for PE but did show signs of bronchitis, aortic atherosclerosis,  small right posterolateral tracheal air diverticulum at T2.   recommended short interval follow-up CT 4-6 weeks.  Respiratory viral panel positive for rhinovirus.  Discharged on 3 more days of Zithromax and prednisone.  DuoNeb every 4 hours.  Supplemental oxygen. WBC 4.8 Crt 0.87 Follow-up Philip [name] 02/12/2022, in which Trelegy was resumed HISTORY:  Past Medical History:  Diagnosis Date   COVID-19    05/15/20   Smoker    History reviewed. No pertinent surgical history. Family History  Problem Relation Age of Onset   Early death Mother    Coronary artery disease Neg Hx     Allergies: Patient has no known allergies. Current Outpatient Medications on File Prior to Visit  Medication Sig Dispense Refill   albuterol (VENTOLIN HFA) 108 (90 Base) MCG/ACT inhaler Inhale 1-2 puffs into the lungs every 6 (six) hours as needed for wheezing or shortness of breath. 18 g 11   Fluticasone-Umeclidin-Vilant (TRELEGY ELLIPTA) 200-62.5-25 MCG/ACT AEPB Inhale 1 puff into the lungs daily. 60 each 11   ibuprofen (ADVIL) 200 MG tablet Take  800 mg by mouth every 8 (eight) hours as needed for fever or moderate pain.     No current facility-administered medications on file prior to visit.    Social History   Tobacco Use   Smoking status: Every Day    Packs/day: 0.50    Years: 30.00    Total pack years: 15.00    Types: Cigarettes   Smokeless tobacco: Never   Tobacco comments:    5-6 cigarettes daily 10/02/2021  Vaping Use   Vaping Use: Never used  Substance Use Topics   Alcohol use: Never   Drug use: Never    Review of Systems  Constitutional:  Negative for chills and fever.  HENT:  Negative for congestion, ear pain, rhinorrhea, sinus pressure and sore throat.   Respiratory:  Positive for cough. Negative for shortness of breath and wheezing.   Cardiovascular:  Negative for chest pain and palpitations.  Gastrointestinal:  Negative for diarrhea, nausea and vomiting.  Genitourinary:  Negative for dysuria.  Musculoskeletal:  Negative for myalgias.  Skin:  Negative for rash.  Neurological:  Negative for headaches.  Hematological:  Negative for adenopathy.      Objective:    BP 136/86 (BP Location: Left Arm, Patient Position: Sitting, Cuff Size: Normal)   Pulse 83   Temp 98.5 F (36.9 C) (Oral)   Ht 5\' 7"  (1.702 m)   Wt 170 lb 6.4 oz (77.3 kg)   SpO2 96%   BMI 26.69 kg/m  BP Readings  from Last 3 Encounters:  03/17/22 136/86  03/13/22 126/84  02/12/22 (!) 148/90   Wt Readings from Last 3 Encounters:  03/17/22 170 lb 6.4 oz (77.3 kg)  03/10/22 174 lb (78.9 kg)  02/12/22 176 lb 6.4 oz (80 kg)    Physical Exam Vitals reviewed.  Constitutional:      Appearance: He is well-developed.  Cardiovascular:     Rate and Rhythm: Regular rhythm.     Heart sounds: Normal heart sounds.  Pulmonary:     Effort: Pulmonary effort is normal. No respiratory distress.     Breath sounds: Normal breath sounds. No wheezing, rhonchi or rales.  Chest:     Comments: Occasional wet cough Skin:    General: Skin is warm and  dry.  Neurological:     Mental Status: He is alert.  Psychiatric:        Speech: Speech normal.        Behavior: Behavior normal.        Assessment & Plan:   Problem List Items Addressed This Visit       Cardiovascular and Mediastinum   Atherosclerosis of aorta (HCC)    The 10-year ASCVD risk score (Wolf Boulay DK, et al., 2019) is: 14.6%.  Patient has quit smoking the past 2 weeks and I congratulated him on his efforts.  He  declines any support in regards to smoking cessation including Chantix, Wellbutrin at this time.  He is not using nicotine patches.  Discussed his overall cardiovascular risk remains elevated and incidental finding of atherosclerosis of aorta and also LAD.  He politely declines statin medication at this time however he is very willing for a consult for risk stratification with cardiology.  Counseled on the importance of low trans  and saturated fat diet.  I have placed this referral.  will follow       Relevant Orders   Ambulatory referral to Cardiology     Respiratory   Acute hypoxic respiratory failure (HCC) - Primary    No acute respiratory distress today.  Occasional cough.  Shortness of breath has resolved at this time.  Hospitalization reviewed with patient, medications reconciled.  Emphasized the importance of continued follow-up with pulmonology.  I have ordered CT chest follow-up in 4 to 6 weeks.  I will share this with Philip [name].  He will continue Trelegy qd, albuterol as needed      Relevant Orders   CT Chest Wo Contrast     I have discontinued Philip [name] azithromycin and nicotine. I am also having him maintain his albuterol, Trelegy Ellipta, and ibuprofen.   No orders of the defined types were placed in this encounter.   Return precautions given.   Risks, benefits, and alternatives of the medications and treatment plan prescribed today were discussed, and patient expressed understanding.   Education regarding symptom management and  diagnosis given to patient on AVS.  Continue to follow with Philip [name] for routine health maintenance.   Philip [name] and I agreed with plan.   Philip Plowman, FNP

## 2022-03-17 NOTE — Telephone Encounter (Signed)
Lft pt vm to call ofc . thanks 

## 2022-03-17 NOTE — Assessment & Plan Note (Signed)
No acute respiratory distress today.  Occasional cough.  Shortness of breath has resolved at this time.  Hospitalization reviewed with patient, medications reconciled.  Emphasized the importance of continued follow-up with pulmonology.  I have ordered CT chest follow-up in 4 to 6 weeks.  I will share this with Dr. Patsey Berthold.  He will continue Trelegy qd, albuterol as needed

## 2022-03-17 NOTE — Patient Instructions (Signed)
Please resume Trelegy as prescribed Dr. Patsey Berthold.  Please call [place] [facility] [phone] to schedule follow-up Dr. Patsey Berthold in the next few weeks.   I have ordered CT chest which is due in 4 to 6 weeks.    Let us know if you dont hear back within a week in regards to an appointment being scheduled.   So that you are aware, if you are [facility] MyChart user , please pay attention to your MyChart messages as you may receive a MyChart message with a phone number to call and schedule this test/appointment own your own from our referral coordinator. This is a new process so I do not want you to miss this message.  If you are not a MyChart user, you will receive a phone call.   We discussed atherosclerosis seen on imaging.  Please consider Crestor or Lipitor (statin medication) for cholesterol management.  I also placed a referral to cardiology for further risk stratification.

## 2022-03-17 NOTE — Assessment & Plan Note (Addendum)
The 10-year ASCVD risk score (Lewellyn Fultz DK, et al., 2019) is: 14.6%.  Patient has quit smoking the past 2 weeks and I congratulated him on his efforts.  He  declines any support in regards to smoking cessation including Chantix, Wellbutrin at this time.  He is not using nicotine patches.  Discussed his overall cardiovascular risk remains elevated and incidental finding of atherosclerosis of aorta and also LAD.  He politely declines statin medication at this time however he is very willing for a consult for risk stratification with cardiology.  Counseled on the importance of low trans  and saturated fat diet.  I have placed this referral.  will follow

## 2022-03-18 ENCOUNTER — Telehealth: Payer: Self-pay | Admitting: Internal Medicine

## 2022-03-18 NOTE — Telephone Encounter (Signed)
Lft pt vm to call ofc . thanks 

## 2022-03-26 ENCOUNTER — Telehealth: Payer: Self-pay

## 2022-03-26 NOTE — Telephone Encounter (Signed)
Received record request from [facility] for [employer] program.   ATC patient to see if he is aware of this. Unable to leave vm due to mailbox being full. Will call back.

## 2022-03-27 NOTE — Telephone Encounter (Signed)
ATC patient--unable to leave vm due to mailbox being full.  Will close encounter per office protocol.  Nothing further needed.

## 2022-03-29 ENCOUNTER — Observation Stay
Admission: EM | Admit: 2022-03-29 | Discharge: 2022-03-30 | Disposition: A | Discharge status: Home / Self Care | Payer: 59 | Attending: Internal Medicine | Admitting: Internal Medicine

## 2022-03-29 ENCOUNTER — Other Ambulatory Visit: Payer: Self-pay

## 2022-03-29 ENCOUNTER — Emergency Department: Payer: 59

## 2022-03-29 ENCOUNTER — Encounter: Payer: Self-pay | Admitting: Internal Medicine

## 2022-03-29 DIAGNOSIS — J4551 Severe persistent asthma with (acute) exacerbation: Secondary | ICD-10-CM | POA: Diagnosis not present

## 2022-03-29 DIAGNOSIS — J209 Acute bronchitis, unspecified: Secondary | ICD-10-CM | POA: Diagnosis present

## 2022-03-29 DIAGNOSIS — Z20822 Contact with and (suspected) exposure to covid-19: Secondary | ICD-10-CM | POA: Diagnosis present

## 2022-03-29 DIAGNOSIS — F17200 Nicotine dependence, unspecified, uncomplicated: Secondary | ICD-10-CM | POA: Insufficient documentation

## 2022-03-29 DIAGNOSIS — F17211 Nicotine dependence, cigarettes, in remission: Secondary | ICD-10-CM | POA: Diagnosis present

## 2022-03-29 DIAGNOSIS — J9601 Acute respiratory failure with hypoxia: Secondary | ICD-10-CM | POA: Diagnosis not present

## 2022-03-29 DIAGNOSIS — Z7951 Long term (current) use of inhaled steroids: Secondary | ICD-10-CM | POA: Diagnosis not present

## 2022-03-29 DIAGNOSIS — R0600 Dyspnea, unspecified: Principal | ICD-10-CM

## 2022-03-29 DIAGNOSIS — F17210 Nicotine dependence, cigarettes, uncomplicated: Secondary | ICD-10-CM | POA: Insufficient documentation

## 2022-03-29 DIAGNOSIS — Z8616 Personal history of COVID-19: Secondary | ICD-10-CM | POA: Diagnosis not present

## 2022-03-29 DIAGNOSIS — Z1152 Encounter for screening for COVID-19: Secondary | ICD-10-CM | POA: Diagnosis not present

## 2022-03-29 DIAGNOSIS — J4541 Moderate persistent asthma with (acute) exacerbation: Secondary | ICD-10-CM | POA: Diagnosis present

## 2022-03-29 DIAGNOSIS — F17201 Nicotine dependence, unspecified, in remission: Secondary | ICD-10-CM | POA: Diagnosis present

## 2022-03-29 DIAGNOSIS — R0602 Shortness of breath: Secondary | ICD-10-CM | POA: Diagnosis present

## 2022-03-29 DIAGNOSIS — J441 Chronic obstructive pulmonary disease with (acute) exacerbation: Secondary | ICD-10-CM | POA: Diagnosis present

## 2022-03-29 DIAGNOSIS — J45901 Unspecified asthma with (acute) exacerbation: Secondary | ICD-10-CM | POA: Insufficient documentation

## 2022-03-29 LAB — RESPIRATORY PANEL BY PCR

## 2022-03-29 LAB — COMPREHENSIVE METABOLIC PANEL
ALT: 33 U/L (ref 0–44)
AST: 23 U/L (ref 15–41)
Albumin: 4.1 g/dL (ref 3.5–5.0)
Alkaline Phosphatase: 83 U/L (ref 38–126)
Anion gap: 8 (ref 5–15)
BUN: 13 mg/dL (ref 8–23)
CO2: 27 mmol/L (ref 22–32)
Calcium: 9.5 mg/dL (ref 8.9–10.3)
Chloride: 106 mmol/L (ref 98–111)
Creatinine, Ser: 1.04 mg/dL (ref 0.61–1.24)
GFR, Estimated: >60 mL/min (ref >60)
Glucose, Bld: 119 mg/dL — ABNORMAL HIGH (ref 70–99)
Potassium: 3.5 mmol/L (ref 3.5–5.1)
Sodium: 141 mmol/L (ref 135–145)
Total Bilirubin: 0.6 mg/dL (ref 0.3–1.2)
Total Protein: 7.7 g/dL (ref 6.5–8.1)

## 2022-03-29 LAB — CBC
HCT: 44.7 % (ref 39.0–52.0)
Hemoglobin: 15.2 g/dL (ref 13.0–17.0)
MCH: 30 pg (ref 26.0–34.0)
MCHC: 34 g/dL (ref 30.0–36.0)
MCV: 88.2 fL (ref 80.0–100.0)
Platelets: 260 10*3/uL (ref 150–400)
RBC: 5.07 MIL/uL (ref 4.22–5.81)
RDW: 12.8 % (ref 11.5–15.5)
WBC: 5.2 10*3/uL (ref 4.0–10.5)
nRBC: 0 % (ref 0.0–0.2)

## 2022-03-29 LAB — RESP PANEL BY RT-PCR (FLU A&B, COVID) ARPGX2
Influenza A by PCR: NEGATIVE
Influenza B by PCR: NEGATIVE
SARS Coronavirus 2 by RT PCR: NEGATIVE

## 2022-03-29 LAB — TROPONIN I (HIGH SENSITIVITY)
Troponin I (High Sensitivity): 6 ng/L (ref <18)
Troponin I (High Sensitivity): 6 ng/L (ref <18)

## 2022-03-29 LAB — BRAIN NATRIURETIC PEPTIDE: B Natriuretic Peptide: 7.3 pg/mL (ref 0.0–100.0)

## 2022-03-29 MED ORDER — IPRATROPIUM-ALBUTEROL 0.5-2.5 (3) MG/3ML IN SOLN
RESPIRATORY_TRACT | Status: AC
  Administered 2022-03-29: 3 mL via RESPIRATORY_TRACT
  Filled 2022-03-29: qty 6

## 2022-03-29 MED ORDER — ACETAMINOPHEN 325 MG PO TABS: 650.0000 mg | ORAL_TABLET | Freq: Four times a day (QID) | ORAL | Status: DC | PRN

## 2022-03-29 MED ORDER — IPRATROPIUM-ALBUTEROL 0.5-2.5 (3) MG/3ML IN SOLN
3.0000 mL | Freq: Once | RESPIRATORY_TRACT | Status: AC
  Administered 2022-03-29: 3 mL via RESPIRATORY_TRACT
  Filled 2022-03-29: qty 3

## 2022-03-29 MED ORDER — METHYLPREDNISOLONE SODIUM SUCC 40 MG IJ SOLR
40.0000 mg | Freq: Two times a day (BID) | INTRAMUSCULAR | Status: AC
  Administered 2022-03-29 (×2): 40 mg via INTRAVENOUS
  Filled 2022-03-29 (×2): qty 1

## 2022-03-29 MED ORDER — ENOXAPARIN SODIUM 40 MG/0.4ML IJ SOSY
40.0000 mg | PREFILLED_SYRINGE | INTRAMUSCULAR | Status: DC
  Administered 2022-03-29: 40 mg via SUBCUTANEOUS
  Filled 2022-03-29 (×2): qty 0.4

## 2022-03-29 MED ORDER — AZITHROMYCIN 250 MG PO TABS
250.0000 mg | ORAL_TABLET | Freq: Every day | ORAL | Status: DC
  Administered 2022-03-30: 250 mg via ORAL
  Filled 2022-03-29: qty 1

## 2022-03-29 MED ORDER — ONDANSETRON HCL 4 MG PO TABS: 4.0000 mg | ORAL_TABLET | Freq: Four times a day (QID) | ORAL | Status: DC | PRN

## 2022-03-29 MED ORDER — PREDNISONE 20 MG PO TABS
40.0000 mg | ORAL_TABLET | Freq: Every day | ORAL | Status: DC
  Administered 2022-03-30: 40 mg via ORAL
  Filled 2022-03-29: qty 2

## 2022-03-29 MED ORDER — ALBUTEROL SULFATE (2.5 MG/3ML) 0.083% IN NEBU: 2.5000 mg | INHALATION_SOLUTION | RESPIRATORY_TRACT | Status: DC | PRN

## 2022-03-29 MED ORDER — ACETAMINOPHEN 650 MG RE SUPP: 650.0000 mg | Freq: Four times a day (QID) | RECTAL | Status: DC | PRN

## 2022-03-29 MED ORDER — GUAIFENESIN ER 600 MG PO TB12
600.0000 mg | ORAL_TABLET | Freq: Two times a day (BID) | ORAL | Status: DC
  Administered 2022-03-29 – 2022-03-30 (×3): 600 mg via ORAL
  Filled 2022-03-29 (×3): qty 1

## 2022-03-29 MED ORDER — IPRATROPIUM-ALBUTEROL 0.5-2.5 (3) MG/3ML IN SOLN
3.0000 mL | Freq: Four times a day (QID) | RESPIRATORY_TRACT | Status: DC
  Administered 2022-03-29 (×3): 3 mL via RESPIRATORY_TRACT
  Filled 2022-03-29 (×4): qty 3

## 2022-03-29 MED ORDER — AZITHROMYCIN 500 MG PO TABS
500.0000 mg | ORAL_TABLET | Freq: Every day | ORAL | Status: AC
  Administered 2022-03-29: 500 mg via ORAL
  Filled 2022-03-29: qty 1

## 2022-03-29 MED ORDER — IPRATROPIUM-ALBUTEROL 0.5-2.5 (3) MG/3ML IN SOLN
3.0000 mL | Freq: Once | RESPIRATORY_TRACT | Status: AC
  Filled 2022-03-29: qty 3

## 2022-03-29 MED ORDER — ONDANSETRON HCL 4 MG/2ML IJ SOLN: 4.0000 mg | Freq: Four times a day (QID) | INTRAMUSCULAR | Status: DC | PRN

## 2022-03-29 NOTE — Assessment & Plan Note (Addendum)
Patient has not smoked since his last discharge on 11/2 Counseling was again provided.

## 2022-03-29 NOTE — ED Provider Notes (Addendum)
[facility] Provider Note    Event Date/Time   First MD Initiated Contact with Patient 03/29/22 0239     (approximate)  History   Chief Complaint: Shortness of Breath  HPI  Philip [name] is a 61 y.o. male with a past medical history of asthma, cigarette smoker, presents to the emergency department for worsening shortness of breath.  According to report wife called EMS as the patient appeared very short of breath gasping for air with a wheeze.  EMS found the patient to have diffuse audible wheeze started the patient on CPAP brought the patient to the emergency department.  Here the patient continues to have wheeze, transition to BiPAP.  Patient denies any chest pain but states he has been coughing.  Denies any known fever.  Physical Exam   Triage Vital Signs: ED Triage Vitals [03/29/22 0235]  Enc Vitals Group     BP (!) 160/125     Pulse Rate (!) 123     Resp (!) 22     Temp 97.8 F (36.6 C)     Temp Source Oral     SpO2 98 %     Weight      Height      Head Circumference      Peak Flow      Pain Score      Pain Loc      Pain Edu?      Excl. in GC?     Most recent vital signs: Vitals:   03/29/22 0235  BP: (!) 160/125  Pulse: (!) 123  Resp: (!) 22  Temp: 97.8 F (36.6 C)  SpO2: 98%    General: Awake, no distress.  CV:  Good peripheral perfusion.  Regular rhythm rate around 120 bpm. Resp:  Moderate expiratory wheeze bilaterally.  Mild tachypnea. Abd:  No distention.  Soft, nontender.  No rebound or guarding.   ED Results / Procedures / Treatments   EKG  EKG viewed and interpreted by myself shows sinus tachycardia 122 bpm with a slightly widened QRS, normal axis, normal intervals with nonspecific ST changes.  RADIOLOGY  I have reviewed and interpreted the chest x-ray images.  No consolidation seen on my evaluation. Radiology has read the x-ray as negative   MEDICATIONS ORDERED IN ED: Medications  ipratropium-albuterol (DUONEB)  0.5-2.5 (3) MG/3ML nebulizer solution 3 mL (has no administration in time range)  ipratropium-albuterol (DUONEB) 0.5-2.5 (3) MG/3ML nebulizer solution 3 mL (has no administration in time range)     IMPRESSION / MDM / ASSESSMENT AND PLAN / ED COURSE  I reviewed the triage vital signs and the nursing notes.  Patient's presentation is most consistent with acute presentation with potential threat to life or bodily function.  Patient presents emergency department for worsening shortness of breath diffuse expiratory wheeze on my examination, transition from CPAP to BiPAP, states he is feeling somewhat better on BiPAP.  Received 2 DuoNebs as well as 125 mg of Solu-Medrol by EMS.  We will dose an additional 2 DuoNebs through the patient's BiPAP mask, we will check labs including cardiac enzymes.  We will obtain a chest x-ray to rule out pneumonia and continue to closely monitor.  We will also check for COVID/flu.  Patient states he was recently admitted to the hospital for shortness of breath.  Patient states they thought he had COPD but this is never been confirmed.  He states he just quit smoking cigarettes 15 days ago otherwise was a daily smoker.  Patient's x-ray is negative.  Lab work is overall reassuring with a normal CBC with normal white blood cell count, reassuring chemistry, negative COVID/flu, normal BNP and a negative troponin x2.  Patient is doing much better on BiPAP, we will continue BiPAP for the patient's likely asthma exacerbation versus undiagnosed COPD.  Given the patient's significant work of breathing upon arrival necessitating BiPAP we will admit to the hospital service for ongoing treatment.  Patient agreeable to plan.  CRITICAL CARE Performed by: Minna Antis   Total critical care time: 30 minutes  Critical care time was exclusive of separately billable procedures and treating other patients.  Critical care was necessary to treat or prevent imminent or life-threatening  deterioration.  Critical care was time spent personally by me on the following activities: development of treatment plan with patient and/or surrogate as well as nursing, discussions with consultants, evaluation of patient's response to treatment, examination of patient, obtaining history from patient or surrogate, ordering and performing treatments and interventions, ordering and review of laboratory studies, ordering and review of radiographic studies, pulse oximetry and re-evaluation of patient's condition.   FINAL CLINICAL IMPRESSION(S) / ED DIAGNOSES   Dyspnea Asthma exacerbation    Note:  This document was prepared using Dragon voice recognition software and may include unintentional dictation errors.   Minna Antis, MD 03/29/22 8828    Minna Antis, MD 03/29/22 [phone]

## 2022-03-29 NOTE — ED Triage Notes (Signed)
Pt from home for shortness of breath, started yesterday morning but worsening through the day.  Pt spouse called EMS when she witnessed pt crawling from bathroom to bedroom. Pt received 2 Albuterol, 2 duoneb, 125 Solumedrol, and 2 grams Magnesium in route from EMS.  Pt endorses feeling "a little better" at this time.

## 2022-03-29 NOTE — Assessment & Plan Note (Addendum)
Continue to have wheeze. -Continue with steroid -Add Zithromax -Incentive spirometry and flutter valve -Continue with supportive care

## 2022-03-29 NOTE — Progress Notes (Signed)
Progress Note   Patient: Philip [name] RKY:[id number] DOB: January 02, 1961 DOA: 03/29/2022     0 DOS: the patient was seen and examined on 03/29/2022   Brief hospital course: Taken from H&P.  Gearold Wainer is a 61 y.o. male with medical history significant for Moderate to severe persistent asthma, tobacco use disorder who stopped smoking 2 weeks ago following recent hospitalization from 10/30 to 11/2 with an acute exacerbation who presents to the ED with a 3-day history of recurrence of wheezing which has been progressively worsening and spite of use of home inhalers.  During his recent stay he was treated with azithromycin  as a CTA chest showed groundglass opacities.  He required up to 4 L at the time.  On arrival of EMS to his home, he was in respiratory distress and placed on CPAP which was transitioned to BiPAP on arrival.  He received DuoNebs in route.   ED course.  Patient had mild tachycardia and tachypnea with blood pressure of 160/125 and saturating well on BiPAP.  Labs including CBC and CMP unremarkable.  Troponin and BNP normal.  COVID and flu PCR negative. Sinus tachycardia nonspecific ST changes. CXR was without any acute abnormality. Patient received Solu-Medrol and DuoNeb in ED.  11/18: Able to wean off from BiPAP, currently saturating well on 3 L of oxygen.  No baseline oxygen use but frequent exacerbations.  Had a follow-up appointment with pulmonology and repeat CT chest early next month.  Assessment and Plan: * Acute hypoxic respiratory failure (HCC) Most likely secondary to COPD/asthma exacerbation.  Patient had recent admission for the similar reason.  Most likely had COPD. Follow-up appointment was set with pulmonology during most recent visit but he had another exacerbation requiring BiPAP, no baseline oxygen use. Currently on 3 L of oxygen. -Continue with supplemental oxygen-wean as tolerated  Severe persistent asthma with acute bronchitis and acute  exacerbation Continue to have wheeze. -Continue with steroid -Add Zithromax -Incentive spirometry and flutter valve -Continue with supportive care  Tobacco use disorder, mild, in early remission Patient has not smoked since his last discharge on 11/2 Counseling was again provided.   Subjective: Patient was feeling little improved and able to speak full sentences when seen today.  Continued to have cough and wheeze.  Physical Exam: Vitals:   03/29/22 1006 03/29/22 1015 03/29/22 1115 03/29/22 1449  BP:  (!) 152/95 121/86 133/82  Pulse:  (!) 115 98 (!) 108  Resp:  20 20 18   Temp:  97.8 F (36.6 C) 97.7 F (36.5 C) 98.6 F (37 C)  TempSrc:   Oral   SpO2: 98% 97% 98% 94%  Weight:      Height:       General.  Well-developed gentleman, in no acute distress. Pulmonary.  Wheeze bilaterally, normal respiratory effort. CV.  Regular rate and rhythm, no JVD, rub or murmur. Abdomen.  Soft, nontender, nondistended, BS positive. CNS.  Alert and oriented .  No focal neurologic deficit. Extremities.  No edema, no cyanosis, pulses intact and symmetrical. Psychiatry.  Judgment and insight appears normal.   Data Reviewed: Prior data reviewed  Family Communication: Discussed with wife at bedside  Disposition: Status is: Inpatient Remains inpatient appropriate because: Severity of illness  Planned Discharge Destination: Home  DVT prophylaxis.  Lovenox Time spent: 45 minutes  This record has been created using . Errors have been sought and corrected,but may not always be located. Such creation errors do not reflect on the standard of care.  Author: Arnetha Courser, MD 03/29/2022 3:05 PM  For on call review [email or web address].

## 2022-03-29 NOTE — Hospital Course (Addendum)
Taken from H&P.  Philip [name] is a 61 y.o. male with medical history significant for Moderate to severe persistent asthma, tobacco use disorder who stopped smoking 2 weeks ago following recent hospitalization from 10/30 to 11/2 with an acute exacerbation who presents to the ED with a 3-day history of recurrence of wheezing which has been progressively worsening and spite of use of home inhalers.  During his recent stay he was treated with azithromycin  as a CTA chest showed groundglass opacities.  He required up to 4 L at the time.  On arrival of EMS to his home, he was in respiratory distress and placed on CPAP which was transitioned to BiPAP on arrival.  He received DuoNebs in route.   ED course.  Patient had mild tachycardia and tachypnea with blood pressure of 160/125 and saturating well on BiPAP.  Labs including CBC and CMP unremarkable.  Troponin and BNP normal.  COVID and flu PCR negative. Sinus tachycardia nonspecific ST changes. CXR was without any acute abnormality. Patient received Solu-Medrol and DuoNeb in ED.  11/18: Able to wean off from BiPAP, currently saturating well on 3 L of oxygen.  No baseline oxygen use but frequent exacerbations.  Had a follow-up appointment with pulmonology and repeat CT chest early next month.  11/19: Patient remained stable, able to wean off to room air.  Ambulating well without any desaturation.  No wheezing on exam today.  Respiratory viral panel negative.  He was given 4 days of Zithromax and a tapering course of steroid.  Patient was counseled again for smoking cessation and need to have a close follow-up with his pulmonologist for further management.  He will continue on current medications and inhalers and follow-up with his providers.

## 2022-03-29 NOTE — Assessment & Plan Note (Signed)
Most likely secondary to COPD/asthma exacerbation.  Patient had recent admission for the similar reason.  Most likely had COPD. Follow-up appointment was set with pulmonology during most recent visit but he had another exacerbation requiring BiPAP, no baseline oxygen use. Currently on 3 L of oxygen. -Continue with supplemental oxygen-wean as tolerated

## 2022-03-29 NOTE — H&P (Signed)
History and Physical    Patient: Philip [name] ZTI:[id number] DOB: 07-16-1960 DOA: 03/29/2022 DOS: the patient was seen and examined on 03/29/2022 PCP: McLean-Scocuzza, Pasty Spillers, MD  Patient coming from: Home  Chief Complaint:  Chief Complaint  Patient presents with   Shortness of Breath    HPI: Philip [name] is a 61 y.o. male with medical history significant for Moderate to severe persistent asthma, tobacco use disorder who stopped smoking 2 weeks ago following recent hospitalization from 10/30 to 11/2 with an acute exacerbation who presents to the ED with a 3-day history of recurrence of wheezing which has been progressively worsening and spite of use of home inhalers.  During his recent stay he was treated with azithromycin  as a CTA chest showed groundglass opacities.  He required up to 4 L at the time.  On arrival of EMS to his home, he was in respiratory distress and placed on CPAP which was transitioned to BiPAP on arrival.  He received DuoNebs in route. ED course and data review: BP 160/125 with pulse 123, respirations 22 and O2 sat 98% on BiPAP.  Labs including CBC and CMP unremarkable.  Troponin and BNP within normal limits.  COVID and flu negative.EKG, personally viewed and interpreted shows sinus tachycardia at 123 with nonspecific ST-T wave changes.  Chest x-ray was nonacute. Patient was treated with DuoNebs and Solu-Medrol but continued to have increased work of breathing, remaining on BiPAP.  Hospitalist consulted for admission.   Review of Systems: As mentioned in the history of present illness. All other systems reviewed and are negative.  Past Medical History:  Diagnosis Date   COVID-19    05/15/20   Smoker    No past surgical history on file. Social History:  reports that he has been smoking cigarettes. He has a 15.00 pack-year smoking history. He has never used smokeless tobacco. He reports that he does not drink alcohol and does not use drugs.  No Known  Allergies  Family History  Problem Relation Age of Onset   Early death Mother    Coronary artery disease Neg Hx     Prior to Admission medications   Medication Sig Start Date End Date Taking? Authorizing Provider  albuterol (VENTOLIN HFA) 108 (90 Base) MCG/ACT inhaler Inhale 1-2 puffs into the lungs every 6 (six) hours as needed for wheezing or shortness of breath. 09/17/21   McLean-Scocuzza, Pasty Spillers, MD  Fluticasone-Umeclidin-Vilant (TRELEGY ELLIPTA) 200-62.5-25 MCG/ACT AEPB Inhale 1 puff into the lungs daily. 02/12/22   Salena Saner, MD  ibuprofen (ADVIL) 200 MG tablet Take 800 mg by mouth every 8 (eight) hours as needed for fever or moderate pain.    [provider]    Physical Exam: Vitals:   03/29/22 0300 03/29/22 0330 03/29/22 0400 03/29/22 0430  BP: (!) 133/98 (!) 147/95 (!) 145/95 (!) 140/91  Pulse: (!) 121 (!) 120 (!) 119 (!) 111  Resp: 16 18  13   Temp:      TempSrc:      SpO2: 95% 95% 95% 96%   Physical Exam Vitals and nursing note reviewed.  Constitutional:      General: He is not in acute distress.    Comments: Bipap, speaking in 1 and 2 word sentences  HENT:     Head: Normocephalic and atraumatic.  Cardiovascular:     Rate and Rhythm: Regular rhythm. Tachycardia present.     Heart sounds: Normal heart sounds.  Pulmonary:     Effort: Tachypnea and accessory muscle  usage present.     Breath sounds: Rhonchi present.  Abdominal:     Palpations: Abdomen is soft.     Tenderness: There is no abdominal tenderness.  Neurological:     Mental Status: Mental status is at baseline.     Labs on Admission: I have personally reviewed following labs and imaging studies  CBC: Recent Labs  Lab 03/29/22 0240  WBC 5.2  HGB 15.2  HCT 44.7  MCV 88.2  PLT [id number]   Basic Metabolic Panel: Recent Labs  Lab 03/29/22 0240  NA 141  K 3.5  CL 106  CO2 27  GLUCOSE 119*  BUN 13  CREATININE 1.04  CALCIUM 9.5   GFR: Estimated Creatinine Clearance: 69.7  mL/min (by C-G formula based on SCr of 1.04 mg/dL). Liver Function Tests: Recent Labs  Lab 03/29/22 0240  AST 23  ALT 33  ALKPHOS 83  BILITOT 0.6  PROT 7.7  ALBUMIN 4.1   No results for input(s): "LIPASE", "AMYLASE" in the last 168 hours. No results for input(s): "AMMONIA" in the last 168 hours. Coagulation Profile: No results for input(s): "INR", "PROTIME" in the last 168 hours. Cardiac Enzymes: No results for input(s): "CKTOTAL", "CKMB", "CKMBINDEX", "TROPONINI" in the last 168 hours. BNP (last 3 results) No results for input(s): "PROBNP" in the last 8760 hours. HbA1C: No results for input(s): "HGBA1C" in the last 72 hours. CBG: No results for input(s): "GLUCAP" in the last 168 hours. Lipid Profile: No results for input(s): "CHOL", "HDL", "LDLCALC", "TRIG", "CHOLHDL", "LDLDIRECT" in the last 72 hours. Thyroid Function Tests: No results for input(s): "TSH", "T4TOTAL", "FREET4", "T3FREE", "THYROIDAB" in the last 72 hours. Anemia Panel: No results for input(s): "VITAMINB12", "FOLATE", "FERRITIN", "TIBC", "IRON", "RETICCTPCT" in the last 72 hours. Urine analysis:    Component Value Date/Time   COLORURINE YELLOW 09/17/2021 1019   APPEARANCEUR CLEAR 09/17/2021 1019   LABSPEC 1.014 09/17/2021 1019   PHURINE 6.0 09/17/2021 1019   GLUCOSEU NEGATIVE 09/17/2021 1019   GLUCOSEU NEGATIVE 10/05/2019 1156   HGBUR NEGATIVE 09/17/2021 [place] 10/05/2019 1156   BILIRUBINUR neg 02/24/2018 0926   KETONESUR NEGATIVE 09/17/2021 1019   PROTEINUR NEGATIVE 09/17/2021 1019   UROBILINOGEN 0.2 10/05/2019 1156   NITRITE NEGATIVE 09/17/2021 1019   LEUKOCYTESUR NEGATIVE 09/17/2021 1019    Radiological Exams on Admission: DG Chest Portable 1 View  Result Date: 03/29/2022 CLINICAL DATA:  Shortness of breath, cough EXAM: PORTABLE CHEST 1 VIEW COMPARISON:  CTA chest dated 03/10/2022 FINDINGS: Lungs are clear.  No pleural effusion or pneumothorax. The heart is normal in size.  IMPRESSION: No evidence of acute cardiopulmonary disease. Electronically Signed   By: Julian Hy M.D.   On: 03/29/2022 03:09     Data Reviewed: Relevant notes from primary care and specialist visits, past discharge summaries as available in EHR, including Care Everywhere. Prior diagnostic testing as pertinent to current admission diagnoses Updated medications and problem lists for reconciliation ED course, including vitals, labs, imaging, treatment and response to treatment Triage notes, nursing and pharmacy notes and ED provider's notes Notable results as noted in HPI   Assessment and Plan: Severe persistent asthma with acute bronchitis and acute exacerbation Acute hypoxic respiratory failure Continue BiPAP and wean as tolerated Schedule and as needed nebulized bronchodilator treatment IV steroids Antitussives, flutter valve, Incentive spirometry  Tobacco use disorder, mild, in early remission Patient has not smoked since his last discharge on 11/2        DVT prophylaxis: Lovenox  Consults: none  Advance Care Planning:   Code Status: Prior   Family Communication: none  Disposition Plan: Back to previous home environment  Severity of Illness: The appropriate patient status for this patient is INPATIENT. Inpatient status is judged to be reasonable and necessary in order to provide the required intensity of service to ensure the patient's safety. The patient's presenting symptoms, physical exam findings, and initial radiographic and laboratory data in the context of their chronic comorbidities is felt to place them at high risk for further clinical deterioration. Furthermore, it is not anticipated that the patient will be medically stable for discharge from the hospital within 2 midnights of admission.   * I certify that at the point of admission it is my clinical judgment that the patient will require inpatient hospital care spanning beyond 2 midnights from the point  of admission due to high intensity of service, high risk for further deterioration and high frequency of surveillance required.*  Author: Athena Masse, MD 03/29/2022 5:03 AM  For on call review [email or web address].

## 2022-03-30 DIAGNOSIS — J9601 Acute respiratory failure with hypoxia: Secondary | ICD-10-CM | POA: Diagnosis not present

## 2022-03-30 MED ORDER — AZITHROMYCIN 250 MG PO TABS: ORAL_TABLET | ORAL | 0 refills | Status: DC

## 2022-03-30 MED ORDER — PREDNISONE 10 MG (21) PO TBPK: ORAL_TABLET | ORAL | 0 refills | Status: DC

## 2022-03-30 MED ORDER — IPRATROPIUM-ALBUTEROL 0.5-2.5 (3) MG/3ML IN SOLN
3.0000 mL | Freq: Four times a day (QID) | RESPIRATORY_TRACT | Status: DC
  Administered 2022-03-30: 3 mL via RESPIRATORY_TRACT
  Filled 2022-03-30: qty 3

## 2022-03-30 MED ORDER — GUAIFENESIN ER 600 MG PO TB12: 600.0000 mg | ORAL_TABLET | Freq: Two times a day (BID) | ORAL | 0 refills | Status: DC

## 2022-03-30 NOTE — Discharge Summary (Signed)
Physician Discharge Summary   Patient: Philip [name] MRN: [id number] DOB: May 15, 1960  Admit date:     03/29/2022  Discharge date: 03/30/22  Discharge Physician: Arnetha CourserSumayya Terris Bodin   PCP: McLean-Scocuzza, Pasty Spillersracy N, MD   Recommendations at discharge:  Patient needed repeat chest imaging according to his schedule Patient need further investigation for a diagnosis of his lung condition Follow-up with primary care provider and pulmonologist  Discharge Diagnoses: Principal Problem:   Acute hypoxic respiratory failure (HCC) Active Problems:   Severe persistent asthma with acute bronchitis and acute exacerbation   Tobacco use disorder, mild, in early remission   Hospital Course: Taken from H&P.  Philip [name] is a 61 y.o. male with medical history significant for Moderate to severe persistent asthma, tobacco use disorder who stopped smoking 2 weeks ago following recent hospitalization from 10/30 to 11/2 with an acute exacerbation who presents to the ED with a 3-day history of recurrence of wheezing which has been progressively worsening and spite of use of home inhalers.  During his recent stay he was treated with azithromycin  as a CTA chest showed groundglass opacities.  He required up to 4 L at the time.  On arrival of EMS to his home, he was in respiratory distress and placed on CPAP which was transitioned to BiPAP on arrival.  He received DuoNebs in route.   ED course.  Patient had mild tachycardia and tachypnea with blood pressure of 160/125 and saturating well on BiPAP.  Labs including CBC and CMP unremarkable.  Troponin and BNP normal.  COVID and flu PCR negative. Sinus tachycardia nonspecific ST changes. CXR was without any acute abnormality. Patient received Solu-Medrol and DuoNeb in ED.  11/18: Able to wean off from BiPAP, currently saturating well on 3 L of oxygen.  No baseline oxygen use but frequent exacerbations.  Had a follow-up appointment with pulmonology and repeat CT chest early  next month.  11/19: Patient remained stable, able to wean off to room air.  Ambulating well without any desaturation.  No wheezing on exam today.  Respiratory viral panel negative.  He was given 4 days of Zithromax and a tapering course of steroid.  Patient was counseled again for smoking cessation and need to have a close follow-up with his pulmonologist for further management.  He will continue on current medications and inhalers and follow-up with his providers.  Assessment and Plan: * Acute hypoxic respiratory failure (HCC) Most likely secondary to COPD/asthma exacerbation.  Patient had recent admission for the similar reason.  Most likely had COPD. Follow-up appointment was set with pulmonology during most recent visit but he had another exacerbation requiring BiPAP, no baseline oxygen use. Currently on 3 L of oxygen. -Continue with supplemental oxygen-wean as tolerated  Severe persistent asthma with acute bronchitis and acute exacerbation Continue to have wheeze. -Continue with steroid -Add Zithromax -Incentive spirometry and flutter valve -Continue with supportive care  Tobacco use disorder, mild, in early remission Patient has not smoked since his last discharge on 11/2 Counseling was again provided.   Consultants: None Procedures performed: None Disposition: Home Diet recommendation:  Discharge Diet Orders (From admission, onward)     Start     Ordered   03/30/22 0000  Diet - low sodium heart healthy        03/30/22 1046           Regular diet DISCHARGE MEDICATION: Allergies as of 03/30/2022   No Known Allergies      Medication List  TAKE these medications    albuterol 108 (90 Base) MCG/ACT inhaler Commonly known as: VENTOLIN HFA Inhale 1-2 puffs into the lungs every 6 (six) hours as needed for wheezing or shortness of breath.   azithromycin 250 MG tablet Commonly known as: ZITHROMAX 1 tablet daily for 4 days   guaiFENesin 600 MG 12 hr  tablet Commonly known as: MUCINEX Take 1 tablet (600 mg total) by mouth 2 (two) times daily.   ibuprofen 200 MG tablet Commonly known as: ADVIL Take 800 mg by mouth every 8 (eight) hours as needed for fever or moderate pain.   predniSONE 10 MG (21) Tbpk tablet Commonly known as: STERAPRED UNI-PAK 21 TAB Take 4 tablet daily for next 3 days, decrease 1 tablet every other day until you complete the packet   Trelegy Ellipta 200-62.5-25 MCG/ACT Aepb Generic drug: Fluticasone-Umeclidin-Vilant Inhale 1 puff into the lungs daily.        Follow-up Information     McLean-Scocuzza, Pasty Spillers, MD. Schedule an appointment as soon as possible for a visit in 1 week(s).   Specialty: Internal Medicine Contact information: [address] [place] [address] [phone]                Discharge Exam: Filed Weights   03/29/22 0710  Weight: 77.3 kg   General.     In no acute distress. Pulmonary.  Lungs clear bilaterally, normal respiratory effort. CV.  Regular rate and rhythm, no JVD, rub or murmur. Abdomen.  Soft, nontender, nondistended, BS positive. CNS.  Alert and oriented .  No focal neurologic deficit. Extremities.  No edema, no cyanosis, pulses intact and symmetrical. Psychiatry.  Judgment and insight appears normal.   Condition at discharge: stable  The results of significant diagnostics from this hospitalization (including imaging, microbiology, ancillary and laboratory) are listed below for reference.   Imaging Studies: DG Chest Portable 1 View  Result Date: 03/29/2022 CLINICAL DATA:  Shortness of breath, cough EXAM: PORTABLE CHEST 1 VIEW COMPARISON:  CTA chest dated 03/10/2022 FINDINGS: Lungs are clear.  No pleural effusion or pneumothorax. The heart is normal in size. IMPRESSION: No evidence of acute cardiopulmonary disease. Electronically Signed   By: Charline Bills M.D.   On: 03/29/2022 03:09   US Venous Img Lower Bilateral (DVT)  Result Date:  03/11/2022 CLINICAL DATA:  [id number] with lower extremity swelling. Lower extremity pain. EXAM: BILATERAL LOWER EXTREMITY VENOUS DOPPLER ULTRASOUND TECHNIQUE: Gray-scale sonography with graded compression, as well as color Doppler and duplex ultrasound were performed to evaluate the lower extremity deep venous systems from the level of the common femoral vein and including the common femoral, femoral, profunda femoral, popliteal and calf veins including the posterior tibial, peroneal and gastrocnemius veins when visible. The superficial great saphenous vein was also interrogated. Spectral Doppler was utilized to evaluate flow at rest and with distal augmentation maneuvers in the common femoral, femoral and popliteal veins. COMPARISON:  None Available. FINDINGS: RIGHT LOWER EXTREMITY Common Femoral Vein: No evidence of thrombus. Normal compressibility, respiratory phasicity and response to augmentation. Saphenofemoral Junction: No evidence of thrombus. Normal compressibility and flow on color Doppler imaging. Profunda Femoral Vein: No evidence of thrombus. Normal compressibility and flow on color Doppler imaging. Femoral Vein: No evidence of thrombus. Normal compressibility, respiratory phasicity and response to augmentation. Popliteal Vein: No evidence of thrombus. Normal compressibility, respiratory phasicity and response to augmentation. Calf Veins: No evidence of thrombus. Normal compressibility and flow on color Doppler imaging. Superficial Great Saphenous Vein: No evidence of thrombus.  Normal compressibility. Venous Reflux:  None. Other Findings:  None. LEFT LOWER EXTREMITY Common Femoral Vein: No evidence of thrombus. Normal compressibility, respiratory phasicity and response to augmentation. Saphenofemoral Junction: No evidence of thrombus. Normal compressibility and flow on color Doppler imaging. Profunda Femoral Vein: No evidence of thrombus. Normal compressibility and flow on color Doppler imaging. Femoral  Vein: No evidence of thrombus. Normal compressibility, respiratory phasicity and response to augmentation. Popliteal Vein: No evidence of thrombus. Normal compressibility, respiratory phasicity and response to augmentation. Calf Veins: No evidence of thrombus. Normal compressibility and flow on color Doppler imaging. Superficial Great Saphenous Vein: No evidence of thrombus. Normal compressibility. Venous Reflux:  None. Other Findings:  None. IMPRESSION: No evidence of deep venous thrombosis in either lower extremity. Electronically Signed   By: Almira Bar M.D.   On: 03/11/2022 01:15   CT Angio Chest Pulmonary Embolism (PE) W or WO Contrast  Result Date: 03/11/2022 CLINICAL DATA:  Pulmonary embolism suspected.  High probability. EXAM: CT ANGIOGRAPHY CHEST WITH CONTRAST TECHNIQUE: Multidetector CT imaging of the chest was performed using the standard protocol during bolus administration of intravenous contrast. Multiplanar CT image reconstructions and MIPs were obtained to evaluate the vascular anatomy. RADIATION DOSE REDUCTION: This exam was performed according to the departmental dose-optimization program which includes automated exposure control, adjustment of the mA and/or kV according to patient size and/or use of iterative reconstruction technique. CONTRAST:  60mL OMNIPAQUE IOHEXOL 350 MG/ML SOLN COMPARISON:  PA and lateral chest today is the only chest prior. FINDINGS: Cardiovascular: There is no evidence of arterial dilatation or emboli. There is mild aortic atherosclerosis. The aorta and great vessels demonstrate no stenosis, aneurysm or dissection. The cardiac size is normal. There is no pericardial effusion. There are patchy calcifications in the proximal LAD coronary artery. The pulmonary veins are decompressed. Mediastinum/Nodes: No enlarged mediastinal, hilar, or axillary lymph nodes. Thyroid gland, trachea, and esophagus demonstrate no significant findings. Scattered shotty subcentimeter in  short axis mediastinal nodes are incidentally seen. No hiatal hernia. Lungs/Pleura: There is diffuse bronchial thickening with scattered small bronchiolar mucous impactions in the upper and lower lobes and peripheral right middle lobe. Mild cylindrical bronchiectasis noted in the lower lung fields in the infrahilar areas. Ground-glass opacities noted anteriorly in the right lung apex and may suggest pneumonitis or chronic changes. The remaining lungs are clear of focal infiltrates. No nodule is seen either side. There is no pleural effusion, thickening or pneumothorax. The main bronchi are patent. There is a small right posterolateral tracheal air diverticulum at T2. Upper Abdomen: No acute abnormality. Moderate abdominal aortic atherosclerosis. Musculoskeletal: Mild thoracic kyphosis. No acute or significant osseous findings. No focal chest wall abnormality. Review of the MIP images confirms the above findings. IMPRESSION: 1. No evidence of arterial dilatation or emboli. 2. Aortic and coronary artery atherosclerosis. 3. Bronchitis with scattered small bronchiolar mucous impactions in all lobes and mild cylindrical bronchiectasis in the lower lung fields. 4. Ground-glass opacity anteriorly in the right lung apex which may suggest pneumonitis or chronic changes. Short interval follow-up CT recommended to ensure clearing. 5. Small right posterolateral tracheal air diverticulum at T2. Aortic Atherosclerosis (ICD10-I70.0). Electronically Signed   By: Almira Bar M.D.   On: 03/11/2022 00:10   DG Chest 2 View  Result Date: 03/10/2022 CLINICAL DATA:  Shortness of breath EXAM: CHEST - 2 VIEW COMPARISON:  None Available. FINDINGS: No pleural effusion. No pneumothorax. Normal cardiac and mediastinal contours. No focal airspace opacity. No displaced rib fractures. Visualized upper abdomen  is unremarkable. Vertebral body heights are maintained. Degenerative changes at the bilateral AC joints. IMPRESSION: No focal  airspace opacity Electronically Signed   By: Lorenza Cambridge M.D.   On: 03/10/2022 18:24    Microbiology: Results for orders placed or performed during the hospital encounter of 03/29/22  Resp Panel by RT-PCR (Flu A&B, Covid) Anterior Nasal Swab     Status: None   Collection Time: 03/29/22  2:41 AM   Specimen: Anterior Nasal Swab  Result Value Ref Range Status   SARS Coronavirus 2 by RT PCR NEGATIVE NEGATIVE Final    Comment: (NOTE) SARS-CoV-2 target nucleic acids are NOT DETECTED.  The SARS-CoV-2 RNA is generally detectable in upper respiratory specimens during the acute phase of infection. The lowest concentration of SARS-CoV-2 viral copies this assay can detect is 138 copies/mL. A negative result does not preclude SARS-Cov-2 infection and should not be used as the sole basis for treatment or other patient management decisions. A negative result may occur with  improper specimen collection/handling, submission of specimen other than nasopharyngeal swab, presence of viral mutation(s) within the areas targeted by this assay, and inadequate number of viral copies(<138 copies/mL). A negative result must be combined with clinical observations, patient history, and epidemiological information. The expected result is Negative.  Fact Sheet for Patients:  BloggerCourse.com  Fact Sheet for Healthcare Providers:  SeriousBroker.it  This test is no t yet approved or cleared by the [place] FDA and  has been authorized for detection and/or diagnosis of SARS-CoV-2 by FDA under an Emergency Use Authorization (EUA). This EUA will remain  in effect (meaning this test can be used) for the duration of the COVID-19 declaration under Section 564(b)(1) of the Act, 21 U.S.C.section 360bbb-3(b)(1), unless the authorization is terminated  or revoked sooner.       Influenza A by PCR NEGATIVE NEGATIVE Final   Influenza B by PCR NEGATIVE NEGATIVE  Final    Comment: (NOTE) The Xpert Xpress SARS-CoV-2/FLU/RSV plus assay is intended as an aid in the diagnosis of influenza from Nasopharyngeal swab specimens and should not be used as a sole basis for treatment. Nasal washings and aspirates are unacceptable for Xpert Xpress SARS-CoV-2/FLU/RSV testing.  Fact Sheet for Patients: BloggerCourse.com  Fact Sheet for Healthcare Providers: SeriousBroker.it  This test is not yet approved or cleared by the [place] FDA and has been authorized for detection and/or diagnosis of SARS-CoV-2 by FDA under an Emergency Use Authorization (EUA). This EUA will remain in effect (meaning this test can be used) for the duration of the COVID-19 declaration under Section 564(b)(1) of the Act, 21 U.S.C. section 360bbb-3(b)(1), unless the authorization is terminated or revoked.  Performed at [facility], [address] [place], [place] [address]   Respiratory (~20 pathogens) panel by PCR     Status: None   Collection Time: 03/29/22  8:44 AM   Specimen: Nasopharyngeal Swab; Respiratory  Result Value Ref Range Status   Adenovirus NOT DETECTED NOT DETECTED Final   Coronavirus 229E NOT DETECTED NOT DETECTED Final    Comment: (NOTE) The Coronavirus on the Respiratory Panel, DOES NOT test for the novel  Coronavirus (2019 nCoV)    Coronavirus HKU1 NOT DETECTED NOT DETECTED Final   Coronavirus NL63 NOT DETECTED NOT DETECTED Final   Coronavirus OC43 NOT DETECTED NOT DETECTED Final   Metapneumovirus NOT DETECTED NOT DETECTED Final   Rhinovirus / Enterovirus NOT DETECTED NOT DETECTED Final   Influenza A NOT DETECTED NOT DETECTED Final   Influenza  B NOT DETECTED NOT DETECTED Final   Parainfluenza Virus 1 NOT DETECTED NOT DETECTED Final   Parainfluenza Virus 2 NOT DETECTED NOT DETECTED Final   Parainfluenza Virus 3 NOT DETECTED NOT DETECTED Final   Parainfluenza Virus 4 NOT DETECTED NOT DETECTED  Final   Respiratory Syncytial Virus NOT DETECTED NOT DETECTED Final   Bordetella pertussis NOT DETECTED NOT DETECTED Final   Bordetella Parapertussis NOT DETECTED NOT DETECTED Final   Chlamydophila pneumoniae NOT DETECTED NOT DETECTED Final   Mycoplasma pneumoniae NOT DETECTED NOT DETECTED Final    Comment: Performed at [facility], [address], [place], [place] [address]    Labs: CBC: Recent Labs  Lab 03/29/22 0240  WBC 5.2  HGB 15.2  HCT 44.7  MCV 88.2  PLT 260   Basic Metabolic Panel: Recent Labs  Lab 03/29/22 0240  NA 141  K 3.5  CL 106  CO2 27  GLUCOSE 119*  BUN 13  CREATININE 1.04  CALCIUM 9.5   Liver Function Tests: Recent Labs  Lab 03/29/22 0240  AST 23  ALT 33  ALKPHOS 83  BILITOT 0.6  PROT 7.7  ALBUMIN 4.1   CBG: No results for input(s): "GLUCAP" in the last 168 hours.  Discharge time spent: greater than 30 minutes.  This record has been created using Conservation officer, historic buildings. Errors have been sought and corrected,but may not always be located. Such creation errors do not reflect on the standard of care.   Signed: Arnetha Courser, MD [facility] 03/30/2022

## 2022-03-30 NOTE — Progress Notes (Signed)
Pt discharged per MD order. IV removed. Discharge instructions reviewed with pt. Pt verbalized understanding. All questions answered to pt satisfaction. Pt declined wheelchair and walked out.

## 2022-03-31 ENCOUNTER — Telehealth: Payer: Self-pay

## 2022-03-31 ENCOUNTER — Other Ambulatory Visit: Payer: Self-pay

## 2022-03-31 NOTE — Telephone Encounter (Signed)
Transition Care Management Follow-up Telephone Call Date of discharge and from where: [place] 03/30/2022 How have you been since you were released from the hospital? better Any questions or concerns? No  Items Reviewed: Did the pt receive and understand the discharge instructions provided? Yes  Medications obtained and verified? Yes  Other? No  Any new allergies since your discharge? No  Dietary orders reviewed? Yes Do you have support at home? Yes   Home Care and Equipment/Supplies: Were home health services ordered? no If so, what is the name of the agency? N/a  Has the agency set up a time to come to the patient's home? no Were any new equipment or medical supplies ordered?  No What is the name of the medical supply agency? N/a Were you able to get the supplies/equipment? not applicable Do you have any questions related to the use of the equipment or supplies? No  Functional Questionnaire: (I = Independent and D = Dependent) ADLs: I  Bathing/Dressing- I  Meal Prep- I  Eating- I  Maintaining continence- I  Transferring/Ambulation- I  Managing Meds- I  Follow up appointments reviewed:  PCP Hospital f/u appt confirmed? No  patient declined appt  will schedule with pulmonologist Specialist Hospital f/u appt confirmed? No   will schedule to see Pulmonologist  Are transportation arrangements needed? No  If their condition worsens, is the pt aware to call PCP or go to the Emergency Dept.? Yes Was the patient provided with contact information for the PCP's office or ED? Yes Was to pt encouraged to call back with questions or concerns? Yes Karena Addison, LPN [facility] Nurse Health Advisor Direct Dial [phone]

## 2022-03-31 NOTE — Telephone Encounter (Signed)
Spoke to patient and scheduled HFU 04/08/2022 at 1:30. Nothing further needed.

## 2022-04-08 ENCOUNTER — Ambulatory Visit (INDEPENDENT_AMBULATORY_CARE_PROVIDER_SITE_OTHER): Payer: 59 | Admitting: Pulmonary Disease

## 2022-04-08 ENCOUNTER — Encounter: Payer: Self-pay | Admitting: Pulmonary Disease

## 2022-04-08 ENCOUNTER — Telehealth: Payer: Self-pay

## 2022-04-08 VITALS — BP 112/68 | HR 80 | Temp 98.2°F | Ht 67.0 in | Wt 180.2 lb

## 2022-04-08 DIAGNOSIS — J4550 Severe persistent asthma, uncomplicated: Secondary | ICD-10-CM

## 2022-04-08 DIAGNOSIS — Z9109 Other allergy status, other than to drugs and biological substances: Secondary | ICD-10-CM

## 2022-04-08 DIAGNOSIS — R053 Chronic cough: Secondary | ICD-10-CM

## 2022-04-08 LAB — NITRIC OXIDE: Nitric Oxide: 154

## 2022-04-08 MED ORDER — TRELEGY ELLIPTA 200-62.5-25 MCG/ACT IN AEPB: 1.0000 | INHALATION_SPRAY | Freq: Every day | RESPIRATORY_TRACT | 11 refills | Status: DC

## 2022-04-08 MED ORDER — AIRSUPRA 90-80 MCG/ACT IN AERO: 2.0000 | INHALATION_SPRAY | Freq: Four times a day (QID) | RESPIRATORY_TRACT | 0 refills | Status: DC | PRN

## 2022-04-08 MED ORDER — AIRSUPRA 90-80 MCG/ACT IN AERO: 2.0000 | INHALATION_SPRAY | RESPIRATORY_TRACT | 6 refills | Status: DC | PRN

## 2022-04-08 MED ORDER — TRELEGY ELLIPTA 200-62.5-25 MCG/ACT IN AEPB: 1.0000 | INHALATION_SPRAY | Freq: Every day | RESPIRATORY_TRACT | 0 refills | Status: DC

## 2022-04-08 NOTE — Progress Notes (Signed)
Subjective:    Patient ID: Daejuan Drennon, male    DOB: 1960-09-28, 61 y.o.   MRN: [id number] Patient Care Team: McLean-Scocuzza, Pasty Spillers, MD as PCP - General (Internal Medicine) Salena Saner, MD as Consulting Physician (Pulmonary Disease)  Chief Complaint  Patient presents with   Follow-up    ED on 03/29/2022 for SOB.  A Little SOB, wheezing and dry cough.    HPI Jakel is a 61 year old current smoker (6 cigarettes/day, 15 PY) who presents for follow-up of cough and shortness of breath.  We first evaluated the patient on 02 Oct 2021 for the issues of shortness of breath, cough and wheezing.  The patient had PFTs are consistent with asthma.  He also had a very positive allergen profile.  He does note that he has seasonal variation to the symptoms.  He does note some increased clear mucus production that he has to expectorate during the day.  This at times is tenacious.  Had allergy evaluation (notes not available) and was told that he was allergic to a type of hay used for bedding in animal cages.  He states that there is a pet rabbit in the home.  He was not tested specifically for allergy to rabbit dander.  We last saw him on 12 February 2022 and obtained IgE to rabbit epithelia which was very high.  The patient was instructed again today that he needs to remove all pet rabbits from the home.   At his first visit here we started him on Trelegy Ellipta, inexplicably he has stopped this medication again.  He does not seem to have full comprehension of the severity of his issues with regards to asthma.  I educated the patient today.    The patient was admitted to [facility] on 10 March 2022 for asthma exacerbation he had a CT chest performed at that time that showed no pulmonary emboli however he had significant bronchial thickening and bronchial mucus impactions in all lobes and mild cylindrical bronchiectasis consistent with active pneumonitis/asthma.  As of his last visit on 12 February 2022 he  has quit smoking.  He was commended on this.   DATA 10/02/2021 eosinophils absolute/allergen panel: EOS abs 0.6 K/uL(600), IgE 286, allergen panel positive across-the-board particularly to dust mites, cat dander, grasses, American cockroach hickory, elm, ragweed.  Other allergens to lesser degree. 11/19/2021 PFTs: FEV1 2.06 L or 64% predicted, FVC 3.39 L or 79% predicted, FEV1/FVC 61%, there is significant bronchodilator response, diffusion capacity normal.  Consistent with airway obstruction, asthmatic type. 02/12/2022 rabbit epithelia IgE: Positive at 7.20 kU/L, class IV, very high. 03/10/2022 CT angio chest: No pulmonary emboli, aortic and coronary artery atherosclerosis, bronchitis with scattered small bronchial mucus impactions in all lobes and mild cylindrical bronchiectasis in the lower lung fields, groundglass opacity anteriorly in the right lung apex which may suggest pneumonitis.  Review of Systems A 10 point review of systems was performed and it is as noted above otherwise negative.  Patient Active Problem List   Diagnosis Date Noted   Environmental allergies 04/08/2022   Chronic cough 04/08/2022   Tobacco use disorder 03/29/2022   Asthma exacerbation 03/29/2022   Tobacco use disorder, mild, in early remission 03/29/2022   Asthma exacerbation in COPD (HCC) 03/29/2022   Atherosclerosis of aorta (HCC) 03/17/2022   Severe persistent asthma with acute bronchitis and acute exacerbation 03/10/2022   Sinus tachycardia 03/10/2022   Acute hypoxic respiratory failure (HCC) 03/10/2022   Bilateral leg pain 03/10/2022   Moderate to  severe persistent allergic asthma 02/12/2022   Kidney stones 09/17/2021   Lumbar back pain 10/05/2019   Left hip pain 10/05/2019   Cigarette smoker 02/24/2018   Social History   Tobacco Use   Smoking status: Former    Current packs/day: 0.00    Average packs/day: 0.5 packs/day for 30.0 years (15.0 ttl pk-yrs)    Types: Cigarettes    Start date:  03/10/1992    Quit date: 03/10/2022    Years since quitting: 0.8   Smokeless tobacco: Never  Substance Use Topics   Alcohol use: Never   Allergies: NKDA  Medications reviewed with patient: Only current medication is albuterol as needed.  Immunization History  Administered Date(s) Administered   Influenza,inj,Quad PF,6+ Mos 01/10/2022   Tdap 10/29/2017      Objective:   Physical Exam BP 112/68 (BP Location: Left Arm, Cuff Size: Normal)   Pulse 80   Temp 98.2 F (36.8 C)   Ht 5\' 7"  (1.702 m)   Wt 180 lb 3.2 oz (81.7 kg)   SpO2 99%   BMI 28.22 kg/m   SpO2: 99 % O2 Device: None (Room air)  GENERAL: Well-developed, well-nourished gentleman, looks younger than stated age.  Fully ambulatory, no conversational dyspnea. HEAD: Normocephalic, atraumatic.  EYES: Pupils equal, round, reactive to light.  No scleral icterus.  MOUTH: Oral mucosa moist.  No thrush. NECK: Supple. No thyromegaly. Trachea midline. No JVD.  No adenopathy. PULMONARY: Good air entry bilaterally.  Faint end expiratory wheeze particularly in the upper lung zones. CARDIOVASCULAR: S1 and S2. Regular rate and rhythm.  No rubs, murmurs or gallops heard. ABDOMEN: Benign. MUSCULOSKELETAL: No joint deformity, no clubbing, no edema.  NEUROLOGIC: No overt focal deficit, no gait disturbance, speech is fluent. SKIN: Intact,warm,dry. PSYCH: Mood and behavior normal    Lab Results  Component Value Date   NITRICOXIDE 154 04/08/2022  *Type II inflammation present - severe     Assessment & Plan:     ICD-10-CM   1. Severe persistent asthma without complication  J45.50 Nitric oxide   Resume Trelegy 200 daily Air Supra (albuterol/budesonide) as rescue Initiate Tezspire paperwork    2. Environmental allergies  Z91.09    Severely allergic to rabbit epithelia and bedding Son keeps a rabbit in the home Recommended that patient remove rabbit from the home    3. Chronic cough  R05.3    Related to poorly controlled  asthma Will improve with therapy     Orders Placed This Encounter  Procedures   Nitric oxide   Meds ordered this encounter  Medications   Fluticasone-Umeclidin-Vilant (TRELEGY ELLIPTA) 200-62.5-25 MCG/ACT AEPB    Sig: Inhale 1 puff into the lungs daily.    Dispense:  14 each    Refill:  0    Order Specific Question:   Lot Number?    Answer:   [id number]    Order Specific Question:   Expiration Date?    Answer:   10/11/2023    Order Specific Question:   Quantity    Answer:   1   Albuterol-Budesonide (AIRSUPRA) 90-80 MCG/ACT AERO    Sig: Inhale 2 puffs into the lungs every 6 (six) hours as needed.    Dispense:  10.7 g    Refill:  0    Order Specific Question:   Lot Number?    Answer:   [id number]    Order Specific Question:   Expiration Date?    Answer:   06/13/2023    Order Specific Question:  Manufacturer?    Answer:   [employer]stra[employer] [[id number]]    Order Specific Question:   Quantity    Answer:   1   Albuterol-Budesonide (AIRSUPRA) 90-80 MCG/ACT AERO    Sig: Inhale 2 puffs into the lungs every 4 (four) hours as needed (shortness ofbreath, wheezing).    Dispense:  10.7 g    Refill:  6   Fluticasone-Umeclidin-Vilant (TRELEGY ELLIPTA) 200-62.5-25 MCG/ACT AEPB    Sig: Inhale 1 puff into the lungs daily.    Dispense:  60 each    Refill:  11    package size 60 each   Patient was instructed to do away with all the rabbits in the home.  He was made aware that he it may take up to 4 to 6 months to clear all the dander from the home however his symptoms should improve.  He had Trelegy Ellipta resumed today.  I am switching his rescue inhaler to AirSupra 2 puffs every 4-6 as needed.  Recommend initiating a biologic, recommend Tezspire.  Initiated paperwork for the same.  Will see the patient in follow-up in 6 to 8 weeks time he is to call sooner should any problems arise.   Gailen Shelter, MD Advanced Bronchoscopy PCCM [facility]    *This note was dictated using voice  recognition software/Dragon.  Despite best efforts to proofread, errors can occur which can change the meaning. Any transcriptional errors that result from this process are unintentional and may not be fully corrected at the time of dictation.

## 2022-04-08 NOTE — Patient Instructions (Addendum)
It is important that you remove the rabbits from the home.  You are very allergic to them and to the bedding.  This triggers your asthma.  Do note, that even after you get the rabbits out of the home it may take up to 4 to 6 months to clear all the dander from around the home.  However, your symptoms should improve.  Your level of inflammation in the airway today was 154 which is very high.  This is an indicator that your asthma is not controlled.  We are changing your rescue inhaler to one called Air Supra we have provided you with a sample and sent the prescription in.  He also have a coupon to help with acute co-pay.  Please do take your Trelegy daily.  This will help keep inflammation controlled in your airway.  We have started the process to get you approved for a shot called Tezspire which is given monthly to help control asthma symptoms.  Will see you in follow-up in 6 to 8 weeks time call sooner should any new problems arise.

## 2022-04-08 NOTE — Telephone Encounter (Signed)
Enrollment form for Philip [name] was filled out in the office and faxed to Pharmacy Team.

## 2022-04-09 ENCOUNTER — Telehealth: Payer: Self-pay

## 2022-04-09 NOTE — Telephone Encounter (Signed)
Received New start paperwork for TEZSPIRE. Will update as we work through the benefits process.  Submitted a Prior Authorization request to [facility] for TEZSPIRE via CoverMyMeds. Will update once we receive a response.  Key: [id number]

## 2022-04-09 NOTE — Telephone Encounter (Signed)
TEZSPIRE BIV started in different telephone encounter.

## 2022-04-14 NOTE — Telephone Encounter (Signed)
Received a fax regarding Prior Authorization from [facility] for TEZSPIRE. Authorization has been DENIED because documentation was not submitted showing two or more asthma exacerbations requiring oral or injectable corticosteroids within the last year, as well as documentation showing that pt has inadequate asthma control despite current treatment with BOTH a high-dose ICS AND an additional controller (long acting beta 2-agonist, long acting muscarinic antagonist, leukotriene modifier, or sustained release theophylline.  Upon further review of the documentation that was submitted with initial request, the OV note from 04/08/22 is still open and does not contain adequate representation of pt's medical history. Will attempt to resubmit PA request and will include OV notes from 10/02/21, 02/12/22, and 04/08/22.  Submitted a Prior Authorization request to [facility] for TEZSPIRE via CoverMyMeds. Will update once we receive a response.  Key: [id number]

## 2022-04-15 ENCOUNTER — Ambulatory Visit
Admission: RE | Admit: 2022-04-15 | Discharge: 2022-04-15 | Disposition: A | Discharge status: Home / Self Care | Payer: 59 | Source: Ambulatory Visit | Attending: Family | Admitting: Family

## 2022-04-15 DIAGNOSIS — J9601 Acute respiratory failure with hypoxia: Secondary | ICD-10-CM | POA: Diagnosis present

## 2022-04-16 ENCOUNTER — Other Ambulatory Visit (HOSPITAL_COMMUNITY): Payer: Self-pay

## 2022-04-16 NOTE — Telephone Encounter (Signed)
Received notification from [facility] regarding a prior authorization for TEZSPIRE. Authorization has been APPROVED from 04/15/2022 to 10/15/2022. Approval letter sent to scan center.  Patient must fill through [facility]: [phone] (or [phone]). Per test claim, medication is PrudentRx eligible.  Pt eligible for copay card.  Authorization # [id number]   Correct Tezspire Together form has been completed and submitted for enrollment into copay card program, will await response.  Fax# [phone] Phone# [phone]

## 2022-04-22 ENCOUNTER — Telehealth: Payer: Self-pay

## 2022-04-22 NOTE — Telephone Encounter (Signed)
Called pt to go over results but was unable to leave VM because VM was full

## 2022-04-24 ENCOUNTER — Telehealth: Payer: Self-pay

## 2022-04-24 NOTE — Telephone Encounter (Signed)
Called pt but was unable to LVM because VM was full

## 2022-04-25 NOTE — Telephone Encounter (Addendum)
Patient has not populated in [facility] [employer] Together portal. Harri[facility] Together to determine status of enrollment  Phone# [phone]   Per rep, copay card is active and patient has received email with copay card information: Patient is not populating into portal because a different site of care was used. Rep states she will call me back with update  At this point, patient can be scheduled for new start appointment since there will be a new Tezspire copay card after 05/12/2022. ATC patient to schedule Tezspire new start. Unable to reach and VM box is full. Will f/u with pt and program  Chesley Mires, PharmD, MPH, BCPS, CPP Clinical Pharmacist (Rheumatology and Pulmonology)

## 2022-04-29 NOTE — Telephone Encounter (Signed)
Per Dorothea Ogle Together po tral, copay card information that is only active through 05/11/2022:  Group: [id number] BIN: [id number] PCN: [id number] ID: [id number]  This is active as of 04/22/2022.  ATC patient to schedule Tezspire new start. Phone went straight to VM. Left VM requesting return call  Chesley Mires, PharmD, MPH, BCPS, CPP Clinical Pharmacist (Rheumatology and Pulmonology)

## 2022-05-06 NOTE — Telephone Encounter (Signed)
ATC # 3 to call to schedule Tezspire new start. Left VM. Patient has not returned my calls.  Patient has appt on 05/20/2022 with Rubye Oaks, NP. If patient still interested in starting, provider can notify pharmacy team. If no response from patient by 05/16/2022, will close this encounter.  Chesley Mires, PharmD, MPH, BCPS, CPP Clinical Pharmacist (Rheumatology and Pulmonology)

## 2022-05-08 NOTE — Telephone Encounter (Signed)
Patient states due to tranportation he cannot come to [facility] for injections. Patient wants to know if there is somewhere in [place] he can go to?

## 2022-05-16 NOTE — Telephone Encounter (Signed)
2nd call. See Bailey's notes:  Patient states due to tranportation he cannot come to [facility] for injections. Patient wants to know if there is somewhere in [place] he can go to?

## 2022-05-16 NOTE — Telephone Encounter (Signed)
I spoke with patient. He states he drives himself to appointments in [place] but does not feel comfortable driving to [place]. He does not have family member or friend to bring him. He has never taken an injectable medication before either. I advised that only the first injection is completed in the clinic and subsequent injections completed at home.  Since I am planning to come out to [place] location on 06/06/22, I've scheduled him for 10am at [facility] location. Tezspire sample will be transported from [place] location   Knox Saliva, PharmD, MPH, BCPS, CPP Clinical Pharmacist (Rheumatology and Pulmonology)

## 2022-05-19 NOTE — Telephone Encounter (Signed)
Enrolled patient into Tezspire copay card: Active as of 05/19/2022 ID: [id number] Group #: [id number] BIN: [id number] PCN: [id number] [id number] Payer ID: [id number]  The patient's enrollment in the [place] Program will expire on May 18, 2025. If you have any questions, please call the [place] Program at [phone] for additional support.

## 2022-05-20 ENCOUNTER — Ambulatory Visit: Payer: 59 | Admitting: Adult Health

## 2022-05-20 ENCOUNTER — Ambulatory Visit: Payer: 59 | Admitting: Family

## 2022-05-20 ENCOUNTER — Ambulatory Visit: Payer: 59 | Admitting: Pulmonary Disease

## 2022-05-21 ENCOUNTER — Telehealth: Payer: Self-pay | Admitting: Family

## 2022-05-21 ENCOUNTER — Other Ambulatory Visit: Payer: Self-pay | Admitting: Family

## 2022-05-21 DIAGNOSIS — F17210 Nicotine dependence, cigarettes, uncomplicated: Secondary | ICD-10-CM

## 2022-05-21 DIAGNOSIS — R053 Chronic cough: Secondary | ICD-10-CM

## 2022-05-21 DIAGNOSIS — J4520 Mild intermittent asthma, uncomplicated: Secondary | ICD-10-CM

## 2022-05-21 MED ORDER — ALBUTEROL SULFATE HFA 108 (90 BASE) MCG/ACT IN AERS: 1.0000 | INHALATION_SPRAY | Freq: Four times a day (QID) | RESPIRATORY_TRACT | 11 refills | Status: DC | PRN

## 2022-05-21 NOTE — Telephone Encounter (Signed)
Spoke to pt and informed him that rx Albuterol inhaler was sent in to pharmacy

## 2022-05-21 NOTE — Telephone Encounter (Signed)
Medication is not covered by insurance. Pharmacy is requesting an alternative.

## 2022-05-21 NOTE — Telephone Encounter (Signed)
Prescription Request  05/21/2022  Is this a "Controlled Substance" medicine? No  LOV: 03/17/2022  What is the name of the medication or equipment? albuterol (VENTOLIN HFA) 108 (90 Base) MCG/ACT inhaler  Have you contacted your pharmacy to request a refill? Yes   Which pharmacy would you like this sent to?   [id number] [place] [place] [address] Phone: [phone] Fax: [phone]    Patient notified that their request is being sent to the clinical staff for review and that they should receive a response within 2 business days.   Please advise at Mobile [phone] is no such number on file (mobile).

## 2022-05-22 NOTE — Telephone Encounter (Signed)
Pt is aware and gave a verbal understanding.  

## 2022-06-06 ENCOUNTER — Ambulatory Visit: Payer: No Typology Code available for payment source | Admitting: Pharmacist

## 2022-06-06 ENCOUNTER — Telehealth: Payer: Self-pay

## 2022-06-06 DIAGNOSIS — Z7189 Other specified counseling: Secondary | ICD-10-CM

## 2022-06-06 DIAGNOSIS — J4550 Severe persistent asthma, uncomplicated: Secondary | ICD-10-CM

## 2022-06-06 MED ORDER — TEZSPIRE 210 MG/1.91ML ~~LOC~~ SOAJ: 210.0000 mg | SUBCUTANEOUS | 5 refills | Status: DC

## 2022-06-06 MED ORDER — BREZTRI AEROSPHERE 160-9-4.8 MCG/ACT IN AERO: 2.0000 | INHALATION_SPRAY | Freq: Two times a day (BID) | RESPIRATORY_TRACT | 0 refills | Status: DC

## 2022-06-06 MED ORDER — BREZTRI AEROSPHERE 160-9-4.8 MCG/ACT IN AERO: 2.0000 | INHALATION_SPRAY | Freq: Two times a day (BID) | RESPIRATORY_TRACT | 5 refills | Status: DC

## 2022-06-06 NOTE — Telephone Encounter (Signed)
Per Devki verbally-give one sample of breztri and d/c trelegy due to rash.  Sample provided.

## 2022-06-06 NOTE — Progress Notes (Unsigned)
HPI Patient presents today to [facility] to see pharmacy team for [facility] new start.  Past medical history includes severe persistent asthma, aortic atherosclerosis, former smoker (?)  He was prescribed Trelegy at last office but states he developed a rash. He discontinued Trelegy and rash resolved. Since then, he has only been taking albuterol prn withno maintenance treatment. Has been off of maintenance treatment for little less than month  Respiratory Medications Current regimen: albuterol prn Patient reports no known adherence challenges  OBJECTIVE No Known Allergies  Outpatient Encounter Medications as of 06/06/2022  Medication Sig   albuterol (VENTOLIN HFA) 108 (90 Base) MCG/ACT inhaler Inhale 1-2 puffs into the lungs every 6 (six) hours as needed for wheezing or shortness of breath.   Albuterol-Budesonide (AIRSUPRA) 90-80 MCG/ACT AERO Inhale 2 puffs into the lungs every 6 (six) hours as needed.   Albuterol-Budesonide (AIRSUPRA) 90-80 MCG/ACT AERO Inhale 2 puffs into the lungs every 4 (four) hours as needed (shortness ofbreath, wheezing).   azithromycin (ZITHROMAX) 250 MG tablet 1 tablet daily for 4 days (Patient not taking: Reported on 04/08/2022)   Fluticasone-Umeclidin-Vilant (TRELEGY ELLIPTA) 200-62.5-25 MCG/ACT AEPB Inhale 1 puff into the lungs daily.   Fluticasone-Umeclidin-Vilant (TRELEGY ELLIPTA) 200-62.5-25 MCG/ACT AEPB Inhale 1 puff into the lungs daily.   guaiFENesin (MUCINEX) 600 MG 12 hr tablet Take 1 tablet (600 mg total) by mouth 2 (two) times daily. (Patient not taking: Reported on 04/08/2022)   ibuprofen (ADVIL) 200 MG tablet Take 800 mg by mouth every 8 (eight) hours as needed for fever or moderate pain. (Patient not taking: Reported on 04/08/2022)   No facility-administered encounter medications on file as of 06/06/2022.     Immunization History  Administered Date(s) Administered   Influenza,inj,Quad PF,6+ Mos 01/10/2022   Tdap 10/29/2017      PFTs    Latest Ref Rng & Units 11/19/2021   10:00 AM  PFT Results  FVC-Pre L 3.39   FVC-Predicted Pre % 79   FVC-Post L 3.89   FVC-Predicted Post % 91   Pre FEV1/FVC % % 61   Post FEV1/FCV % % 65   FEV1-Pre L 2.06   FEV1-Predicted Pre % 64   FEV1-Post L 2.53   DLCO uncorrected ml/min/mmHg 24.50   DLCO UNC% % 98   DLVA Predicted % 97   TLC L 6.56   TLC % Predicted % 102   RV % Predicted % 143      Eosinophils Most recent blood eosinophil count was 200 cells/microL taken on 10/02/2021.   IgE: 286 on 10/02/2021   Assessment   Biologics training for tezepulumab Cheron Every)  Goals of therapy: Mechanism: human monoclonal IgG2? antibody that binds to TSLP. This blocks TSLP from its effect on inflammation including reduce eosinophils, IgE, FeNO, IL-5, and IL-13. Mechanism is not definitively established. Reviewed that Cheron Every is add-on medication and patient must continue maintenance inhaler regimen. Response to therapy: may take 3-4 months to determine efficacy.  Side effects: injection site reaction (6-18%), antibody development (2%), arthralgia (4%), back pain (4%), pharyngitis (4%)  Dose: Tezspire 210 mg once every 4 weeks  Administration/Storage:  Reviewed administration sites of thigh or abdomen (at least 2-3 inches away from abdomen). Reviewed the upper arm is only appropriate if caregiver is administering injection  Do not shake pen/syringe as this could lead to product foaming or precipitation.  Access: Approval of Tezspire through: insurance Patient enrolled into copay card program to help with copay assistance.  Patient self-administered Tezspire 210mg /1.91 ml in left  lower abdomen using sample Tezspire 210mg /1.91 ml Autoinjector pen NDC: [phone] Lot: [id number] Expiration: 04/10/2024  Patient monitored for 30 minutes for adverse reaction.  Patient tolerated without issue. Injection site checked and no redness or swelling noted. Patient denies itchiness  and irritation  Medication Reconciliation  A drug regimen assessment was performed, including review of allergies, interactions, disease-state management, dosing and immunization history. Medications were reviewed with the patient, including name, instructions, indication, goals of therapy, potential side effects, importance of adherence, and safe use.  Drug interaction(s): none noted  Trelegy added to allergy list today   PLAN Continue Tezspire 210mg  SQ every 28 days.  [facility] [phone].   START Breztri 160-9-4.78mcg (2 puffs twice daily).  Emphasized importance of maintenance inhaler use while on Tezspire. CONTINUE albuterol rescue as needed only  All questions encouraged and answered.  Instructed patient to reach out with any further questions or concerns.  Thank you for allowing pharmacy to participate in this patient's care.  This appointment required 45 minutes of patient care (this includes precharting, chart review, review of results, face-to-face care, etc.).  Knox Saliva, PharmD, MPH, BCPS, CPP Clinical Pharmacist (Rheumatology and Pulmonology)

## 2022-06-06 NOTE — Patient Instructions (Signed)
Your next TEZSPIRE dose is due on 07/04/2022, 08/01/2022, and every 4 weeks thereafter  START BREZTRI 2 puffs twice daily. Call us if any issues with it (coverage, side effects). We need you on a maintenance inhaler while on Tezspire  Your prescription will be shipped from [facility]. Their phone number is [phone] Please call to schedule shipment and confirm address. They will mail your medication to your home.  Your copay should be affordable. If you call the pharmacy and it is not affordable, please double-check that they are billing through your copay card as secondary coverage. That copay card information is: Active as of 05/19/2022 ID:  [id number] Group #:  [id number] BIN:  [id number] PCN:  [id number]  You will need to be seen by your provider in 3 to 4 months to assess how TEZSPIRE is working for you. Please ensure you have a follow-up appointment scheduled in 3-4 months with Dr. Patsey Berthold. Call our clinic if you need to make this appointment.  How to manage an injection site reaction: Remember the 5 C's: COUNTER - leave on the counter at least 30 minutes but up to overnight to bring medication to room temperature. This may help prevent stinging COLD - place something cold (like an ice gel pack or cold water bottle) on the injection site just before cleansing with alcohol. This may help reduce pain CLARITIN - use Claritin (generic name is loratadine) for the first two weeks of treatment or the day of, the day before, and the day after injecting. This will help to minimize injection site reactions CORTISONE CREAM - apply if injection site is irritated and itching CALL ME - if injection site reaction is bigger than the size of your fist, looks infected, blisters, or if you develop hives

## 2022-08-12 ENCOUNTER — Ambulatory Visit (INDEPENDENT_AMBULATORY_CARE_PROVIDER_SITE_OTHER): Payer: No Typology Code available for payment source | Admitting: Pulmonary Disease

## 2022-08-12 ENCOUNTER — Encounter: Payer: Self-pay | Admitting: Pulmonary Disease

## 2022-08-12 VITALS — BP 130/80 | HR 74 | Temp 97.3°F | Ht 67.0 in | Wt 188.0 lb

## 2022-08-12 DIAGNOSIS — Z87891 Personal history of nicotine dependence: Secondary | ICD-10-CM

## 2022-08-12 DIAGNOSIS — L2089 Other atopic dermatitis: Secondary | ICD-10-CM | POA: Diagnosis not present

## 2022-08-12 DIAGNOSIS — J4550 Severe persistent asthma, uncomplicated: Secondary | ICD-10-CM | POA: Diagnosis not present

## 2022-08-12 LAB — NITRIC OXIDE: Nitric Oxide: 33

## 2022-08-12 NOTE — Progress Notes (Signed)
Subjective:    Patient ID: Philip [name], male    DOB: [birth year], 62 y.o.   MRN: [id number] Patient Care Team: Burnard Hawthorne, FNP as PCP - General (Family Medicine) Tyler Pita, MD as Consulting Physician (Pulmonary Disease)  Chief Complaint  Patient presents with   Follow-up    SOB when working out. No wheezing or cough.    HPI Philip [name] is a 62 year old former smoker who presents for follow-up on the issue of severe persistent asthma.  He was last seen here on 08 April 2022.  At that time his nitric oxide was severely elevated at 154 ppb.  A prior evaluation has shown that the patient was severely allergic to rabbits and his son kept rabbits in the home.  He was instructed to remove the rabbits from the home.  He also was not taking Trelegy daily.  He was instructed to take Trelegy daily.  Patient was subsequently started on Tezspire on 26 January.  Patient stopped the use of Trelegy due to a "rash" he is currently on Breztri which is controlling him well.  His rash is limited to upper arms and on further evaluation it appears to be an atopic type of rash.  It is very mild and per patient appears to be resolving.  He has not had any fevers, chills or sweats.  No cough or sputum production.  No hemoptysis.  No chest tightness or chest pain.  No orthopnea or paroxysmal nocturnal dyspnea.  No lower extremity edema nor calf tenderness.  Overall he feels well and looks well.  DATA 10/02/2021 eosinophils absolute/allergen panel: EOS abs 0.6 K/uL(600), IgE 286, allergen panel positive across-the-board particularly to dust mites, cat dander, grasses, American cockroach hickory, elm, ragweed.  Other allergens to lesser degree. 11/19/2021 PFTs: FEV1 2.06 L or 64% predicted, FVC 3.39 L or 79% predicted, FEV1/FVC 61%, there is significant bronchodilator response, diffusion capacity normal.  Consistent with airway obstruction, asthmatic type. 02/12/2022 rabbit epithelia IgE: Positive at 7.20  kU/L, class IV, very high. 03/10/2022 CT angio chest: No pulmonary emboli, aortic and coronary artery atherosclerosis, bronchitis with scattered small bronchial mucus impactions in all lobes and mild cylindrical bronchiectasis in the lower lung fields, groundglass opacity anteriorly in the right lung apex which may suggest pneumonitis. 04/15/2022 CT chest without contrast: Resolved groundglass opacity, resolved bronchial inflammation and peripheral bronchial mucous plugging seen previously.  Review of Systems A 10 point review of systems was performed and it is as noted above otherwise negative.  Patient Active Problem List   Diagnosis Date Noted   Environmental allergies 04/08/2022   Chronic cough 04/08/2022   Tobacco use disorder 03/29/2022   Asthma exacerbation 03/29/2022   Tobacco use disorder, mild, in early remission 03/29/2022   Asthma exacerbation in COPD 03/29/2022   Atherosclerosis of aorta 03/17/2022   Severe persistent asthma with acute bronchitis and acute exacerbation 03/10/2022   Sinus tachycardia 03/10/2022   Acute hypoxic respiratory failure 03/10/2022   Bilateral leg pain 03/10/2022   Moderate to severe persistent allergic asthma 02/12/2022   Kidney stones 09/17/2021   Lumbar back pain 10/05/2019   Left hip pain 10/05/2019   Cigarette smoker 02/24/2018   Social History   Tobacco Use   Smoking status: Former    Packs/day: 0.50    Years: 30.00    Additional pack years: 0.00    Total pack years: 15.00    Types: Cigarettes    Quit date: 03/10/2022    Years since quitting: 0.4  Smokeless tobacco: Never  Substance Use Topics   Alcohol use: Never   Allergies  Allergen Reactions   Trelegy Ellipta [Fluticasone-Umeclidin-Vilant] Rash    Resolved after discontinuing Trelegy   Current Meds  Medication Sig   albuterol (VENTOLIN HFA) 108 (90 Base) MCG/ACT inhaler Inhale 1-2 puffs into the lungs every 6 (six) hours as needed for wheezing or shortness of breath.    Budeson-Glycopyrrol-Formoterol (BREZTRI AEROSPHERE) 160-9-4.8 MCG/ACT AERO Inhale 2 puffs into the lungs in the morning and at bedtime.   Tezepelumab-ekko (TEZSPIRE) 210 MG/1.91ML SOAJ Inject 210 mg into the skin every 28 (twenty-eight) days.   Immunization History  Administered Date(s) Administered   Influenza,inj,Quad PF,6+ Mos 01/10/2022   Tdap 10/29/2017      Objective:   Physical Exam BP 130/80 (BP Location: Left Arm, Cuff Size: Normal)   Pulse 74   Temp (!) 97.3 F (36.3 C)   Ht 5\' 7"  (1.702 m)   Wt 188 lb (85.3 kg)   SpO2 99%   BMI 29.44 kg/m   SpO2: 99 % O2 Device: None (Room air)  GENERAL: Well-developed, well-nourished gentleman, looks younger than stated age.  Fully ambulatory, no conversational dyspnea. HEAD: Normocephalic, atraumatic.  EYES: Pupils equal, round, reactive to light.  No scleral icterus.  MOUTH: Oral mucosa moist.  No thrush. NECK: Supple. No thyromegaly. Trachea midline. No JVD.  No adenopathy. PULMONARY: Good air entry bilaterally.  No adventitious sounds. CARDIOVASCULAR: S1 and S2. Regular rate and rhythm.  No rubs, murmurs or gallops heard. ABDOMEN: Benign. MUSCULOSKELETAL: No joint deformity, no clubbing, no edema.  NEUROLOGIC: No overt focal deficit, no gait disturbance, speech is fluent. SKIN: Intact,warm,dry. PSYCH: Mood and behavior normal.    Lab Results  Component Value Date   NITRICOXIDE 33 08/12/2022  *Previous value 154 ppb     Assessment & Plan:     ICD-10-CM   1. Severe persistent asthma without complication  123XX123 Nitric oxide   Better control of type II inflammation Continue Breztri 2 puffs twice a day Continue as needed albuterol Continue Tezspire    2. Other atopic dermatitis  L20.89    Mild, resolving Likely associated with asthma    3. Former smoker  Z87.891    No evidence of relapse Congratulated on abstinence     Patient appears to be well compensated at present.  Follow-up will be in 3 to 4 months  time he is to contact us prior to that time should any new difficulties arise.  Renold Don, MD Advanced Bronchoscopy P[facility]    *This note was dictated using voice recognition software/Dragon.  Despite best efforts to proofread, errors can occur which can change the meaning. Any transcriptional errors that result from this process are unintentional and may not be fully corrected at the time of dictation.

## 2022-08-12 NOTE — Patient Instructions (Signed)
Continue using your Judithann Sauger and as needed albuterol (read inhaler)  Continue your shots.  We will see on follow-up in 3 to 4 months time call sooner should any new problems arise.

## 2022-09-16 ENCOUNTER — Telehealth: Payer: Self-pay

## 2022-09-16 NOTE — Telephone Encounter (Signed)
Received faxed PA renewal form from [facility] for TEZSPIRE. Completed PA questions and submitted request via fax along with supporting chart notes. Will await response.  Phone # [phone] Fax # [phone]

## 2022-09-17 NOTE — Telephone Encounter (Signed)
Received notification from [facility] regarding a prior authorization for TEZSPIRE. Authorization has been APPROVED from 09/16/2022 to 09/16/2023. Approval letter sent to scan center.  Authorization # [id number]

## 2022-11-19 ENCOUNTER — Ambulatory Visit: Payer: No Typology Code available for payment source | Admitting: Pulmonary Disease

## 2023-01-16 ENCOUNTER — Encounter: Payer: Self-pay | Admitting: Pulmonary Disease

## 2023-03-19 ENCOUNTER — Other Ambulatory Visit: Payer: Self-pay | Admitting: Pulmonary Disease

## 2023-03-19 DIAGNOSIS — J4550 Severe persistent asthma, uncomplicated: Secondary | ICD-10-CM

## 2023-06-03 ENCOUNTER — Ambulatory Visit: Payer: No Typology Code available for payment source | Admitting: Family

## 2023-06-16 ENCOUNTER — Other Ambulatory Visit: Payer: Self-pay | Admitting: Family

## 2023-06-16 DIAGNOSIS — R053 Chronic cough: Secondary | ICD-10-CM

## 2023-06-16 DIAGNOSIS — F17210 Nicotine dependence, cigarettes, uncomplicated: Secondary | ICD-10-CM

## 2023-06-16 DIAGNOSIS — J4520 Mild intermittent asthma, uncomplicated: Secondary | ICD-10-CM

## 2023-06-18 ENCOUNTER — Ambulatory Visit: Payer: No Typology Code available for payment source | Admitting: Family

## 2023-06-18 ENCOUNTER — Encounter: Payer: Self-pay | Admitting: Family

## 2023-06-18 VITALS — BP 132/82 | HR 78 | Temp 97.8°F | Ht 67.0 in | Wt 189.4 lb

## 2023-06-18 DIAGNOSIS — Z122 Encounter for screening for malignant neoplasm of respiratory organs: Secondary | ICD-10-CM

## 2023-06-18 DIAGNOSIS — Z1211 Encounter for screening for malignant neoplasm of colon: Secondary | ICD-10-CM

## 2023-06-18 DIAGNOSIS — I700 Atherosclerosis of aorta: Secondary | ICD-10-CM | POA: Diagnosis not present

## 2023-06-18 DIAGNOSIS — R300 Dysuria: Secondary | ICD-10-CM

## 2023-06-18 DIAGNOSIS — J4540 Moderate persistent asthma, uncomplicated: Secondary | ICD-10-CM | POA: Diagnosis not present

## 2023-06-18 DIAGNOSIS — F17210 Nicotine dependence, cigarettes, uncomplicated: Secondary | ICD-10-CM | POA: Diagnosis not present

## 2023-06-18 DIAGNOSIS — Z125 Encounter for screening for malignant neoplasm of prostate: Secondary | ICD-10-CM

## 2023-06-18 LAB — URINALYSIS, ROUTINE W REFLEX MICROSCOPIC
Bilirubin Urine: NEGATIVE
Hgb urine dipstick: NEGATIVE
Ketones, ur: NEGATIVE
Leukocytes,Ua: NEGATIVE
Nitrite: NEGATIVE
RBC / HPF: NONE SEEN (ref 0–?)
Specific Gravity, Urine: 1.01 (ref 1.000–1.030)
Total Protein, Urine: NEGATIVE
Urine Glucose: NEGATIVE
Urobilinogen, UA: 1 (ref 0.0–1.0)
WBC, UA: NONE SEEN (ref 0–?)
pH: 6.5 (ref 5.0–8.0)

## 2023-06-18 NOTE — Assessment & Plan Note (Signed)
 Chronic, stable. Advised to schedule annual follow up with Dr Viva Grise.

## 2023-06-18 NOTE — Patient Instructions (Signed)
 You are due for annual lung cancer screening program.      I have placed a referral to [facility] pulmonology  and their office will reach out to you to schedule your CT of your chest.  They will reach out to you annually going forward.  If you do not hear from their office in the next 1 to 2 weeks, please call [facility]monology at [phone]   So let me know if there are any issues in getting scheduled.   I placed a referral to gastroenterology to arrange colonoscopy  Let us  know if you dont hear back within a week in regards to an appointment being scheduled.   So that you are aware, if you are [facility] MyChart user , please pay attention to your MyChart messages as you may receive a MyChart message with a phone number to call and schedule this test/appointment own your own from our referral coordinator. This is a new process so I do not want you to miss this message.  If you are not a MyChart user, you will receive a phone call.    Please let me know if urinary symptoms persist as this may warrant further evaluation with urology

## 2023-06-18 NOTE — Assessment & Plan Note (Signed)
 Intermittent.  Afebrile.  Await urine culture urinalysis.

## 2023-06-18 NOTE — Progress Notes (Signed)
 Assessment & Plan:  Screening for colon cancer -     Ambulatory referral to Gastroenterology  Screening for lung cancer  Atherosclerosis of aorta (HCC) -     VITAMIN D  25 Hydroxy (Vit-D Deficiency, Fractures); Future -     CBC with Differential/Platelet; Future -     Comprehensive metabolic panel; Future -     Lipid panel; Future -     TSH; Future  Cigarette smoker -     Ambulatory Referral for Lung Cancer Scre; Future  Screening for prostate cancer -     PSA; Future  Dysuria Assessment & Plan: Intermittent.  Afebrile.  Await urine culture urinalysis.  Orders: -     Urinalysis, Routine w reflex microscopic -     Urine Culture  Moderate to severe persistent allergic asthma Assessment & Plan: Chronic, stable. Advised to schedule annual follow up with Dr Tamea.       Return precautions given.   Risks, benefits, and alternatives of the medications and treatment plan prescribed today were discussed, and patient expressed understanding.   Education regarding symptom management and diagnosis given to patient on AVS either electronically or printed.  Return in about 3 months (around 09/15/2023) for Fasting labs in 2-3 weeks.  Rollene Northern, FNP  Subjective:    Patient ID: Philip [name], male    DOB: 09-17-1960, 63 y.o.   MRN: [id number]  CC: Philip [name] is a 63 y.o. male who presents today for follow up.   HPI: Complains 'a little' dysuria, intermittent, for 3 weeks, unchanged.   No fever, abdominal pain, constipation, rash, decreased  urinary stream, urinary frequency  He rides bicycle 1-2 miles , 2 days per week without CP, sob.  He is compliant with this.  Feels regimen is effective for him.  Symptoms are well-controlled.  No longer has a rabbit in the home.   Last seen by pulmonology 04/08/2022 for severe persistent asthma. He was on Trelegy, Airsupra  at that time  Smoker   Allergies: Trelegy ellipta  [fluticasone -umeclidin-vilant] Current Outpatient  Medications on File Prior to Visit  Medication Sig Dispense Refill   albuterol  (VENTOLIN  HFA) 108 (90 Base) MCG/ACT inhaler INHALE 1-2 PUFFS BY MOUTH EVERY 6 HOURS AS NEEDED FOR WHEEZE OR SHORTNESS OF BREATH 18 each 5   Budeson-Glycopyrrol-Formoterol  (BREZTRI  AEROSPHERE) 160-9-4.8 MCG/ACT AERO INHALE 2 PUFFS INTO THE LUNGS IN THE MORNING AND AT BEDTIME. 10.7 each 11   cholecalciferol (VITAMIN D3) 25 MCG (1000 UNIT) tablet Take 1,000 Units by mouth daily.     No current facility-administered medications on file prior to visit.    Review of Systems  Constitutional:  Negative for chills and fever.  Respiratory:  Negative for cough.   Cardiovascular:  Negative for chest pain and palpitations.  Gastrointestinal:  Negative for nausea and vomiting.  Genitourinary:  Positive for dysuria. Negative for hematuria.  Musculoskeletal:  Negative for back pain.      Objective:    BP 132/82   Pulse 78   Temp 97.8 F (36.6 C) (Oral)   Ht 5' 7 (1.702 m)   Wt 189 lb 6.4 oz (85.9 kg)   SpO2 98%   BMI 29.66 kg/m  BP Readings from Last 3 Encounters:  06/18/23 132/82  08/12/22 130/80  04/08/22 112/68   Wt Readings from Last 3 Encounters:  06/18/23 189 lb 6.4 oz (85.9 kg)  08/12/22 188 lb (85.3 kg)  04/08/22 180 lb 3.2 oz (81.7 kg)    Physical Exam Vitals reviewed.  Constitutional:  Appearance: He is well-developed.  Cardiovascular:     Rate and Rhythm: Regular rhythm.     Heart sounds: Normal heart sounds.  Pulmonary:     Effort: Pulmonary effort is normal. No respiratory distress.     Breath sounds: Normal breath sounds. No wheezing, rhonchi or rales.  Skin:    General: Skin is warm and dry.  Neurological:     Mental Status: He is alert.  Psychiatric:        Speech: Speech normal.        Behavior: Behavior normal.

## 2023-06-19 LAB — URINE CULTURE
MICRO NUMBER:: 16050849
Result:: NO GROWTH
SPECIMEN QUALITY:: ADEQUATE

## 2023-06-23 ENCOUNTER — Telehealth: Payer: Self-pay

## 2023-06-23 ENCOUNTER — Other Ambulatory Visit: Payer: Self-pay

## 2023-06-23 DIAGNOSIS — Z1211 Encounter for screening for malignant neoplasm of colon: Secondary | ICD-10-CM

## 2023-06-23 MED ORDER — NA SULFATE-K SULFATE-MG SULF 17.5-3.13-1.6 GM/177ML PO SOLN: 1.0000 | Freq: Once | ORAL | 0 refills | Status: AC

## 2023-06-23 NOTE — Telephone Encounter (Signed)
Gastroenterology Pre-Procedure Review  Request Date: 07/28/23 Requesting Physician: Dr. Allegra Lai  PATIENT REVIEW QUESTIONS: The patient responded to the following health history questions as indicated:    1. Are you having any GI issues? no 2. Do you have a personal history of Polyps? no 3. Do you have a family history of Colon Cancer or Polyps? no 4. Diabetes Mellitus? no 5. Joint replacements in the past 12 months?no 6. Major health problems in the past 3 months?no 7. Any artificial heart valves, MVP, or defibrillator?no    MEDICATIONS & ALLERGIES:    Patient reports the following regarding taking any anticoagulation/antiplatelet therapy:   Plavix, Coumadin, Eliquis, Xarelto, Lovenox, Pradaxa, Brilinta, or Effient? no Aspirin? no  Patient confirms/reports the following medications:  Current Outpatient Medications  Medication Sig Dispense Refill   Na Sulfate-K Sulfate-Mg Sulfate concentrate 17.5-3.13-1.6 GM/177ML SOLN Take 1 kit by mouth once for 1 dose. 354 mL 0   albuterol (VENTOLIN HFA) 108 (90 Base) MCG/ACT inhaler INHALE 1-2 PUFFS BY MOUTH EVERY 6 HOURS AS NEEDED FOR WHEEZE OR SHORTNESS OF BREATH 18 each 5   Budeson-Glycopyrrol-Formoterol (BREZTRI AEROSPHERE) 160-9-4.8 MCG/ACT AERO INHALE 2 PUFFS INTO THE LUNGS IN THE MORNING AND AT BEDTIME. 10.7 each 11   cholecalciferol (VITAMIN D3) 25 MCG (1000 UNIT) tablet Take 1,000 Units by mouth daily.     No current facility-administered medications for this visit.    Patient confirms/reports the following allergies:  Allergies  Allergen Reactions   Trelegy Ellipta [Fluticasone-Umeclidin-Vilant] Rash    Resolved after discontinuing Trelegy    No orders of the defined types were placed in this encounter.   AUTHORIZATION INFORMATION Primary Insurance: 1D#: Group #:  Secondary Insurance: 1D#: Group #:  SCHEDULE INFORMATION: Date: 07/28/23 Time: Location: [facility]

## 2023-07-16 ENCOUNTER — Encounter: Payer: Self-pay | Admitting: Gastroenterology

## 2023-07-28 ENCOUNTER — Ambulatory Visit
Admission: RE | Admit: 2023-07-28 | Discharge: 2023-07-28 | Disposition: A | Discharge status: Home / Self Care | Payer: No Typology Code available for payment source | Attending: Gastroenterology | Admitting: Gastroenterology

## 2023-07-28 ENCOUNTER — Ambulatory Visit: Payer: Self-pay | Admitting: Anesthesiology

## 2023-07-28 ENCOUNTER — Other Ambulatory Visit: Payer: Self-pay

## 2023-07-28 ENCOUNTER — Encounter: Payer: Self-pay | Admitting: Gastroenterology

## 2023-07-28 ENCOUNTER — Encounter
Admission: RE | Disposition: A | Discharge status: Home / Self Care | Payer: Self-pay | Source: Home / Self Care | Attending: Gastroenterology

## 2023-07-28 DIAGNOSIS — J4550 Severe persistent asthma, uncomplicated: Secondary | ICD-10-CM | POA: Insufficient documentation

## 2023-07-28 DIAGNOSIS — J4489 Other specified chronic obstructive pulmonary disease: Secondary | ICD-10-CM | POA: Insufficient documentation

## 2023-07-28 DIAGNOSIS — D123 Benign neoplasm of transverse colon: Secondary | ICD-10-CM | POA: Insufficient documentation

## 2023-07-28 DIAGNOSIS — Z1211 Encounter for screening for malignant neoplasm of colon: Secondary | ICD-10-CM | POA: Insufficient documentation

## 2023-07-28 DIAGNOSIS — Z87891 Personal history of nicotine dependence: Secondary | ICD-10-CM | POA: Diagnosis not present

## 2023-07-28 DIAGNOSIS — D124 Benign neoplasm of descending colon: Secondary | ICD-10-CM | POA: Insufficient documentation

## 2023-07-28 DIAGNOSIS — K635 Polyp of colon: Secondary | ICD-10-CM

## 2023-07-28 HISTORY — DX: Chronic cough: R05.3

## 2023-07-28 HISTORY — DX: Severe persistent asthma, uncomplicated: J45.50

## 2023-07-28 HISTORY — DX: Unspecified asthma, uncomplicated: J45.909

## 2023-07-28 HISTORY — PX: POLYPECTOMY: SHX5525

## 2023-07-28 HISTORY — PX: COLONOSCOPY WITH PROPOFOL: SHX5780

## 2023-07-28 SURGERY — COLONOSCOPY WITH PROPOFOL
Anesthesia: General | Site: Rectum

## 2023-07-28 MED ORDER — LIDOCAINE HCL (CARDIAC) PF 100 MG/5ML IV SOSY
PREFILLED_SYRINGE | INTRAVENOUS | Status: DC | PRN
  Administered 2023-07-28: 20 mg via INTRAVENOUS

## 2023-07-28 MED ORDER — PHENYLEPHRINE 80 MCG/ML (10ML) SYRINGE FOR IV PUSH (FOR BLOOD PRESSURE SUPPORT)
PREFILLED_SYRINGE | INTRAVENOUS | Status: AC
  Filled 2023-07-28: qty 10

## 2023-07-28 MED ORDER — STERILE WATER FOR IRRIGATION IR SOLN
Status: DC | PRN
  Administered 2023-07-28 (×2): 60 mL

## 2023-07-28 MED ORDER — PHENYLEPHRINE HCL (PRESSORS) 10 MG/ML IV SOLN
INTRAVENOUS | Status: DC | PRN
Start: 2023-07-28 — End: 2023-07-28
  Administered 2023-07-28 (×2): 120 ug via INTRAVENOUS

## 2023-07-28 MED ORDER — LACTATED RINGERS IV SOLN: INTRAVENOUS | Status: DC

## 2023-07-28 MED ORDER — PROPOFOL 10 MG/ML IV BOLUS
INTRAVENOUS | Status: DC | PRN
  Administered 2023-07-28: 100 mg via INTRAVENOUS
  Administered 2023-07-28: 40 mg via INTRAVENOUS
  Administered 2023-07-28: 30 mg via INTRAVENOUS
  Administered 2023-07-28: 50 mg via INTRAVENOUS
  Administered 2023-07-28: 40 mg via INTRAVENOUS

## 2023-07-28 MED ORDER — STERILE WATER FOR IRRIGATION IR SOLN
Status: DC | PRN
  Administered 2023-07-28: 1

## 2023-07-28 MED ORDER — SODIUM CHLORIDE 0.9 % IV SOLN: INTRAVENOUS | Status: DC

## 2023-07-28 SURGICAL SUPPLY — 16 items
CLIP HMST 235XBRD CATH ROT (MISCELLANEOUS) IMPLANT
ELECT REM PT RETURN 9FT ADLT (ELECTROSURGICAL) IMPLANT
ELECTRODE REM PT RTRN 9FT ADLT (ELECTROSURGICAL) IMPLANT
FORCEPS BIOP RAD 4 LRG CAP 4 (CUTTING FORCEPS) IMPLANT
GOWN CVR UNV OPN BCK APRN NK (MISCELLANEOUS) ×4 IMPLANT
INJECTOR VARIJECT VIN23 (MISCELLANEOUS) IMPLANT
KIT DEFENDO VALVE AND CONN (KITS) IMPLANT
KIT PRC NS LF DISP ENDO (KITS) ×2 IMPLANT
MANIFOLD NEPTUNE II (INSTRUMENTS) ×2 IMPLANT
MARKER SPOT ENDO TATTOO 5ML (MISCELLANEOUS) IMPLANT
PROBE APC STR FIRE (PROBE) IMPLANT
RETRIEVER NET ROTH 2.5X230 LF (MISCELLANEOUS) IMPLANT
SNARE COLD EXACTO (MISCELLANEOUS) IMPLANT
TRAP ETRAP POLY (MISCELLANEOUS) IMPLANT
VARIJECT INJECTOR VIN23 (MISCELLANEOUS) IMPLANT
WATER STERILE IRR 250ML POUR (IV SOLUTION) ×2 IMPLANT

## 2023-07-28 NOTE — Anesthesia Preprocedure Evaluation (Signed)
 Anesthesia Evaluation  Patient identified by MRN, date of birth, ID band Patient awake    Reviewed: Allergy & Precautions, H&P , NPO status , Patient's Chart, lab work & pertinent test results  Airway Mallampati: II  TM Distance: >3 FB Neck ROM: Full    Dental no notable dental hx. (+) Edentulous Upper, Edentulous Lower   Pulmonary neg pulmonary ROS, asthma , COPD, Patient abstained from smoking., former smoker   Pulmonary exam normal breath sounds clear to auscultation       Cardiovascular negative cardio ROS Normal cardiovascular exam Rhythm:Regular Rate:Normal     Neuro/Psych negative neurological ROS  negative psych ROS   GI/Hepatic negative GI ROS, Neg liver ROS,,,  Endo/Other  negative endocrine ROS    Renal/GU Renal diseasenegative Renal ROS  negative genitourinary   Musculoskeletal negative musculoskeletal ROS (+)    Abdominal   Peds negative pediatric ROS (+)  Hematology negative hematology ROS (+)   Anesthesia Other Findings Smoker  COVID-19 Asthma  Severe persistent asthma--takes inhaler daily, auscultates clear today and states does not need inhaler at present Chronic cough     Reproductive/Obstetrics negative OB ROS                              Anesthesia Physical Anesthesia Plan  ASA: 3  Anesthesia Plan: General   Post-op Pain Management:    Induction: Intravenous  PONV Risk Score and Plan:   Airway Management Planned: Natural Airway and Nasal Cannula  Additional Equipment:   Intra-op Plan:   Post-operative Plan:   Informed Consent: I have reviewed the patients History and Physical, chart, labs and discussed the procedure including the risks, benefits and alternatives for the proposed anesthesia with the patient or authorized representative who has indicated his/her understanding and acceptance.     Dental Advisory Given  Plan Discussed with:  Anesthesiologist, CRNA and Surgeon  Anesthesia Plan Comments: (Patient consented for risks of anesthesia including but not limited to:  - adverse reactions to medications - risk of airway placement if required - damage to eyes, teeth, lips or other oral mucosa - nerve damage due to positioning  - sore throat or hoarseness - Damage to heart, brain, nerves, lungs, other parts of body or loss of life  Patient voiced understanding and assent.)         Anesthesia Quick Evaluation

## 2023-07-28 NOTE — Transfer of Care (Signed)
 Immediate Anesthesia Transfer of Care Note  Patient: Philip [name]  Procedure(s) Performed: COLONOSCOPY WITH PROPOFOL (Rectum) POLYPECTOMY (Rectum)  Patient Location: PACU  Anesthesia Type: General  Level of Consciousness: awake, alert  and patient cooperative  Airway and Oxygen Therapy: Patient Spontanous Breathing and Patient connected to supplemental oxygen  Post-op Assessment: Post-op Vital signs reviewed, Patient's Cardiovascular Status Stable, Respiratory Function Stable, Patent Airway and No signs of Nausea or vomiting  Post-op Vital Signs: Reviewed and stable  Complications: No notable events documented.

## 2023-07-28 NOTE — Anesthesia Postprocedure Evaluation (Signed)
 Anesthesia Post Note  Patient: Philip [name]  Procedure(s) Performed: COLONOSCOPY WITH PROPOFOL (Rectum) POLYPECTOMY (Rectum)  Patient location during evaluation: PACU Anesthesia Type: General Level of consciousness: awake and alert Pain management: pain level controlled Vital Signs Assessment: post-procedure vital signs reviewed and stable Respiratory status: spontaneous breathing, nonlabored ventilation, respiratory function stable and patient connected to nasal cannula oxygen Cardiovascular status: blood pressure returned to baseline and stable Postop Assessment: no apparent nausea or vomiting Anesthetic complications: no   No notable events documented.   Last Vitals:  Vitals:   07/28/23 0923 07/28/23 0930  BP: 99/71 105/73  Pulse: 91 91  Resp: 15 (!) 21  Temp:  36.6 C  SpO2: 96% 98%    Last Pain:  Vitals:   07/28/23 0930  TempSrc:   PainSc: 0-No pain                 Francee Setzer C Tija Biss

## 2023-07-28 NOTE — Op Note (Signed)
 [facility] Gastroenterology Patient Name: Philip [name] Procedure Date: 07/28/2023 8:46 AM MRN: [id number] Account #: [id number] Date of Birth: [birth year] Admit Type: Outpatient Age: 63 Room: [facility] OR ROOM 01 Gender: Male Note Status: Finalized Instrument Name: [id number] Procedure:             Colonoscopy Indications:           Screening for colorectal malignant neoplasm Providers:             Toney Reil MD, MD Referring MD:          Lyn Records. Arnett (Referring MD) Medicines:             General Anesthesia Complications:         No immediate complications. Estimated blood loss: None. Procedure:             Pre-Anesthesia Assessment:                        - Prior to the procedure, a History and Physical was                         performed, and patient medications and allergies were                         reviewed. The patient is competent. The risks and                         benefits of the procedure and the sedation options and                         risks were discussed with the patient. All questions                         were answered and informed consent was obtained.                         Patient identification and proposed procedure were                         verified by the physician, the nurse, the                         anesthesiologist, the anesthetist and the technician                         in the pre-procedure area in the procedure room in the                         endoscopy suite. Mental Status Examination: alert and                         oriented. Airway Examination: normal oropharyngeal                         airway and neck mobility. Respiratory Examination:                         clear to auscultation. CV Examination: normal.  Prophylactic Antibiotics: The patient does not require                         prophylactic antibiotics. Prior Anticoagulants: The                         patient has  taken no anticoagulant or antiplatelet                         agents. ASA Grade Assessment: III - A patient with                         severe systemic disease. After reviewing the risks and                         benefits, the patient was deemed in satisfactory                         condition to undergo the procedure. The anesthesia                         plan was to use general anesthesia. Immediately prior                         to administration of medications, the patient was                         re-assessed for adequacy to receive sedatives. The                         heart rate, respiratory rate, oxygen saturations,                         blood pressure, adequacy of pulmonary ventilation, and                         response to care were monitored throughout the                         procedure. The physical status of the patient was                         re-assessed after the procedure.                        After obtaining informed consent, the colonoscope was                         passed under direct vision. Throughout the procedure,                         the patient's blood pressure, pulse, and oxygen                         saturations were monitored continuously. The                         Colonoscope was introduced through the anus and  advanced to the the cecum, identified by appendiceal                         orifice and ileocecal valve. The colonoscopy was                         performed without difficulty. The patient tolerated                         the procedure well. The quality of the bowel                         preparation was evaluated using the BBPS [facility] Bowel                         Preparation Scale) with scores of: Right Colon = 3,                         Transverse Colon = 3 and Left Colon = 3 (entire mucosa                         seen well with no residual staining, small fragments                         of  stool or opaque liquid). The total BBPS score                         equals 9. The ileocecal valve, appendiceal orifice,                         and rectum were photographed. Findings:      The perianal and digital rectal examinations were normal. Pertinent       negatives include normal sphincter tone and no palpable rectal lesions.      Three sessile polyps were found in the descending colon and transverse       colon. The polyps were 4 to 8 mm in size. These polyps were removed with       a cold snare. Resection and retrieval were complete. Estimated blood       loss was minimal.      The retroflexed view of the distal rectum and anal verge was normal and       showed no anal or rectal abnormalities. Impression:            - Three 4 to 8 mm polyps in the descending colon and                         in the transverse colon, removed with a cold snare.                         Resected and retrieved.                        - The distal rectum and anal verge are normal on                         retroflexion view. Recommendation:        -  Discharge patient to home (with escort).                        - Resume previous diet today.                        - Continue present medications.                        - Await pathology results.                        - Repeat colonoscopy in 3 - 5 years for surveillance                         of multiple polyps. Procedure Code(s):     --- Professional ---                        [phone], Colonoscopy, flexible; with removal of                         tumor(s), polyp(s), or other lesion(s) by snare                         technique Diagnosis Code(s):     --- Professional ---                        Z12.11, Encounter for screening for malignant neoplasm                         of colon                        D12.4, Benign neoplasm of descending colon                        D12.3, Benign neoplasm of transverse colon (hepatic                          flexure or splenic flexure) CPT copyright 2022 American Medical Association. All rights reserved. The codes documented in this report are preliminary and upon coder review may  be revised to meet current compliance requirements. Dr. Libby Maw Toney Reil MD, MD 07/28/2023 9:14:29 AM This report has been signed electronically. Number of Addenda: 0 Note Initiated On: 07/28/2023 8:46 AM Scope Withdrawal Time: 0 hours 12 minutes 52 seconds  Total Procedure Duration: 0 hours 14 minutes 7 seconds  Estimated Blood Loss:  Estimated blood loss: none.      [facility]

## 2023-07-28 NOTE — H&P (Signed)
 Arlyss Repress, MD [address]  [address]  [place], [place] [address]  Main: [phone]  Fax: [phone] Pager: [phone]  Primary Care Physician:  Allegra Grana, FNP Primary Gastroenterologist:  Dr. Arlyss Repress  Pre-Procedure History & Physical: HPI:  Philip [name] is a 63 y.o. male is here for an colonoscopy.   Past Medical History:  Diagnosis Date   Asthma    Chronic cough    COVID-19    05/15/20   Severe persistent asthma    Smoker     History reviewed. No pertinent surgical history.  Prior to Admission medications   Medication Sig Start Date End Date Taking? Authorizing Provider  albuterol (VENTOLIN HFA) 108 (90 Base) MCG/ACT inhaler INHALE 1-2 PUFFS BY MOUTH EVERY 6 HOURS AS NEEDED FOR WHEEZE OR SHORTNESS OF BREATH 06/16/23  Yes Allegra Grana, FNP  Budeson-Glycopyrrol-Formoterol (BREZTRI AEROSPHERE) 160-9-4.8 MCG/ACT AERO INHALE 2 PUFFS INTO THE LUNGS IN THE MORNING AND AT BEDTIME. 03/19/23  Yes Salena Saner, MD  cholecalciferol (VITAMIN D3) 25 MCG (1000 UNIT) tablet Take 1,000 Units by mouth daily.   Yes [provider]    Allergies as of 06/23/2023 - Review Complete 06/23/2023  Allergen Reaction Noted   Trelegy ellipta [fluticasone-umeclidin-vilant] Rash 06/06/2022    Family History  Problem Relation Age of Onset   Early death Mother    Diabetes Sister    Aneurysm Sister 30       brain aneurysm   Coronary artery disease Neg Hx    Prostate cancer Neg Hx     Social History   Socioeconomic History   Marital status: Married    Spouse name: Not on file   Number of children: Not on file   Years of education: Not on file   Highest education level: Not on file  Occupational History   Not on file  Tobacco Use   Smoking status: Former    Current packs/day: 0.00    Average packs/day: 0.5 packs/day for 30.0 years (15.0 ttl pk-yrs)    Types: Cigarettes    Start date: 03/10/1992    Quit date: 03/10/2022    Years since  quitting: 1.3   Smokeless tobacco: Never  Vaping Use   Vaping status: Never Used  Substance and Sexual Activity   Alcohol use: Never   Drug use: Never   Sexual activity: Yes  Other Topics Concern   Not on file  Social History Narrative   Married    Son ( 15 yrs) and wife   Works Advertising account planner , Editor, commissioning.   Social Drivers of Corporate investment banker Strain: Not on file  Food Insecurity: No Food Insecurity (03/29/2022)   Hunger Vital Sign    Worried About Running Out of Food in the Last Year: Never true    Ran Out of Food in the Last Year: Never true  Transportation Needs: No Transportation Needs (03/29/2022)   PRAPARE - Administrator, Civil Service (Medical): No    Lack of Transportation (Non-Medical): No  Physical Activity: Not on file  Stress: Not on file  Social Connections: Not on file  Intimate Partner Violence: Not At Risk (03/29/2022)   Humiliation, Afraid, Rape, and Kick questionnaire    Fear of Current or Ex-Partner: No    Emotionally Abused: No    Physically Abused: No    Sexually Abused: No    Review of Systems: See HPI, otherwise negative ROS  Physical Exam: BP 134/83  Pulse (!) 109   Temp (!) 97.3 F (36.3 C) (Temporal)   Resp 18   Ht 5\' 7"  (1.702 m)   Wt 83 kg   SpO2 97%   BMI 28.66 kg/m  General:   Alert,  pleasant and cooperative in NAD Head:  Normocephalic and atraumatic. Neck:  Supple; no masses or thyromegaly. Lungs:  Clear throughout to auscultation.    Heart:  Regular rate and rhythm. Abdomen:  Soft, nontender and nondistended. Normal bowel sounds, without guarding, and without rebound.   Neurologic:  Alert and  oriented x4;  grossly normal neurologically.  Impression/Plan: Philip [name] is here for an colonoscopy to be performed for colon cancer screening  Risks, benefits, limitations, and alternatives regarding  colonoscopy have been reviewed with the patient.  Questions have been answered.  All parties  agreeable.   Lannette Donath, MD  07/28/2023, 8:19 AM

## 2023-07-29 ENCOUNTER — Encounter: Payer: Self-pay | Admitting: Gastroenterology

## 2023-07-30 ENCOUNTER — Encounter: Payer: Self-pay | Admitting: Gastroenterology

## 2023-07-30 LAB — SURGICAL PATHOLOGY

## 2024-05-08 ENCOUNTER — Other Ambulatory Visit: Payer: Self-pay | Admitting: Family

## 2024-05-08 ENCOUNTER — Other Ambulatory Visit: Payer: Self-pay | Admitting: Pulmonary Disease

## 2024-05-08 DIAGNOSIS — J4520 Mild intermittent asthma, uncomplicated: Secondary | ICD-10-CM

## 2024-05-08 DIAGNOSIS — R053 Chronic cough: Secondary | ICD-10-CM

## 2024-05-08 DIAGNOSIS — F17210 Nicotine dependence, cigarettes, uncomplicated: Secondary | ICD-10-CM

## 2024-05-08 DIAGNOSIS — J4550 Severe persistent asthma, uncomplicated: Secondary | ICD-10-CM

## 2024-05-09 ENCOUNTER — Telehealth: Payer: Self-pay

## 2024-05-09 ENCOUNTER — Other Ambulatory Visit: Payer: Self-pay | Admitting: Family

## 2024-05-09 DIAGNOSIS — F17210 Nicotine dependence, cigarettes, uncomplicated: Secondary | ICD-10-CM

## 2024-05-09 DIAGNOSIS — J4520 Mild intermittent asthma, uncomplicated: Secondary | ICD-10-CM

## 2024-05-09 DIAGNOSIS — J4550 Severe persistent asthma, uncomplicated: Secondary | ICD-10-CM

## 2024-05-09 DIAGNOSIS — R053 Chronic cough: Secondary | ICD-10-CM

## 2024-05-09 NOTE — Telephone Encounter (Signed)
 Called pt due to us  receiving a refill request and noticed on his last office visit with his PCP Dineen, NP on 06-18-23 wanted pt to follow up in 3 months and have labs scheduled prior. Detailed vm left asking pt to CB to scheduled :    Return in about 3 months (around 09/15/2023) for Fasting labs in 2-3 weeks.

## 2024-05-09 NOTE — Telephone Encounter (Unsigned)
 Copied from CRM [id number]. Topic: Clinical - Medication Refill >> May 09, 2024 11:40 AM Viola F wrote: Medication: albuterol  (VENTOLIN  HFA) 108 (90 Base) MCG/ACT inhaler [[id number]] Budeson-Glycopyrrol-Formoterol  (BREZTRI  AEROSPHERE) 160-9-4.8 MCG/ACT AERO [[id number]]  Has the patient contacted their pharmacy? Yes (Agent: If no, request that the patient contact the pharmacy for the refill. If patient does not wish to contact the pharmacy document the reason why and proceed with request.) (Agent: If yes, when and what did the pharmacy advise?)  This is the patient's preferred pharmacy:  [facility] [phone] [address], [place] - [address] [place] [address] Phone: [phone] Fax: [phone]  Is this the correct pharmacy for this prescription? Yes If no, delete pharmacy and type the correct one.   Has the prescription been filled recently? Yes  Is the patient out of the medication? Yes, for a day   Has the patient been seen for an appointment in the last year OR does the patient have an upcoming appointment? Yes  Can we respond through MyChart? No  Agent: Please be advised that Rx refills may take up to 3 business days. We ask that you follow-up with your pharmacy.

## 2024-05-09 NOTE — Telephone Encounter (Signed)
 Called pt due to us  receiving a refill request and noticed on his last office visit with his PCP Dineen, NP on 06-18-23 wanted pt to follow up in 3 months and have labs scheduled prior. Detailed vm left asking pt to CB to scheduled :    Return in about 3 months (around 09/15/2023) for Fasting labs in 2-3 weeks.   E2C2 please have pt scheduled for an appt with his pcp and a lab appt prior to OV with pcp

## 2024-05-10 MED ORDER — BREZTRI AEROSPHERE 160-9-4.8 MCG/ACT IN AERO: 2.0000 | INHALATION_SPRAY | Freq: Two times a day (BID) | RESPIRATORY_TRACT | 11 refills | Status: AC

## 2024-05-10 MED ORDER — ALBUTEROL SULFATE HFA 108 (90 BASE) MCG/ACT IN AERS: INHALATION_SPRAY | RESPIRATORY_TRACT | 5 refills | Status: AC

## 2024-05-10 NOTE — Telephone Encounter (Signed)
 Pt is overdue for f/u pulmonology dr tamea He is also due for lung cancer screening program d/t h/o smoking  Let me know if agreeable and I will place referral  Refilled inhalers today

## 2024-05-10 NOTE — Telephone Encounter (Unsigned)
 Copied from CRM [id number]. Topic: Clinical - Medical Advice >> May 10, 2024  2:40 PM Mercedes MATSU wrote: Reason for CRM: Patient is calling in stating that he will like the smokers referral sent as well as the inhalers. Is still requesting follow up call and can be reached at [phone].

## 2024-05-10 NOTE — Telephone Encounter (Signed)
LVM to call back to discuss message below.

## 2024-05-11 ENCOUNTER — Encounter: Payer: Self-pay | Admitting: Family

## 2024-05-11 ENCOUNTER — Other Ambulatory Visit: Payer: Self-pay | Admitting: Family

## 2024-05-11 DIAGNOSIS — J4540 Moderate persistent asthma, uncomplicated: Secondary | ICD-10-CM

## 2024-05-11 DIAGNOSIS — Z122 Encounter for screening for malignant neoplasm of respiratory organs: Secondary | ICD-10-CM

## 2024-05-13 NOTE — Telephone Encounter (Signed)
 Pt has been notified.

## 2024-05-18 ENCOUNTER — Other Ambulatory Visit: Payer: Self-pay

## 2024-05-18 ENCOUNTER — Telehealth: Payer: Self-pay

## 2024-05-18 DIAGNOSIS — Z122 Encounter for screening for malignant neoplasm of respiratory organs: Secondary | ICD-10-CM

## 2024-05-18 DIAGNOSIS — F17210 Nicotine dependence, cigarettes, uncomplicated: Secondary | ICD-10-CM

## 2024-05-18 DIAGNOSIS — Z87891 Personal history of nicotine dependence: Secondary | ICD-10-CM

## 2024-05-18 NOTE — Telephone Encounter (Addendum)
 Lung Cancer Screening Narrative/Criteria Questionnaire (Cigarette Smokers Only- No Cigars/Pipes/vapes)   Philip [name]   SDMV:06/01/2024 at 2:00 pm Kristen  1961-01-13               LDCT: 06/02/2024 at 9:45 am [facility]  /  63 y.o./   Phone: [phone]  Lung Screening Narrative (confirm age 5-77 yrs Medicare / 50-80 yrs Private pay insurance)   Insurance information: BCBS    Referring Provider: Dineen, NP   This screening involves an initial phone call with a team member from our program. It is called a shared decision making visit. The initial meeting is required by  insurance and Medicare to make sure you understand the program. This appointment takes about 15-20 minutes to complete. You will complete the screening scan at your scheduled date/time.  This scan takes about 5-10 minutes to complete. You can eat and drink normally before and after the scan.  Criteria questions for Lung Cancer Screening:   Are you a current or former smoker? Current Age began smoking: 66   If you are a former smoker, what year did you quit smoking? Never quit over one year. (within 15 yrs)   To calculate your smoking history, I need an accurate estimate of how many packs of cigarettes you smoked per day and for how many years. (Not just the number of PPD you are now smoking)   Years smoking 45 x Packs per day 1 = Pack years 45   (at least 20 pack yrs)   (Make sure they understand that we need to know how much they have smoked in the past, not just the number of PPD they are smoking now)  Do you have a personal history of cancer?  No    Do you have a family history of cancer? No  Are you coughing up blood?  No  Have you had unexplained weight loss of 15 lbs or more in the last 6 months? No  It looks like you meet all criteria.  When would be a good time for us  to schedule you for this screening?   Additional information: N/A

## 2024-05-23 ENCOUNTER — Encounter: Admitting: Adult Health

## 2024-05-27 ENCOUNTER — Encounter: Payer: Self-pay | Admitting: Pulmonary Disease

## 2024-05-27 ENCOUNTER — Ambulatory Visit: Admitting: Pulmonary Disease

## 2024-05-27 VITALS — BP 140/90 | HR 79 | Ht 67.0 in | Wt 188.0 lb

## 2024-05-27 DIAGNOSIS — J209 Acute bronchitis, unspecified: Secondary | ICD-10-CM

## 2024-05-27 DIAGNOSIS — Z87891 Personal history of nicotine dependence: Secondary | ICD-10-CM

## 2024-05-27 DIAGNOSIS — J4550 Severe persistent asthma, uncomplicated: Secondary | ICD-10-CM

## 2024-05-27 LAB — NITRIC OXIDE: Nitric Oxide: 8

## 2024-05-27 NOTE — Progress Notes (Signed)
 "  Subjective:    Patient ID: Philip [name], male    DOB: June 20, 1960, 64 y.o.   MRN: [id number]  Patient Care Team: Dineen Rollene MATSU, FNP as PCP - General (Family Medicine) Tamea Dedra CROME, MD as Consulting Physician (Pulmonary Disease)  Chief Complaint  Patient presents with   Follow-up    BACKGROUND/INTERVAL:Donn is a 64 year old former smoker who presents for follow-up on the issue of severe persistent asthma. He was last seen here on 12 August 2022.   HPI Discussed the use of AI scribe software for clinical note transcription with the patient, who gave verbal consent to proceed.  History of Present Illness   He is a 64 year old male with asthma who presents for follow-up.  He is not currently using Breztri .  He does use albuterol  as needed.  He has eliminated the significant allergen that triggered him which was rabbit bedding and epithelia.  His son had a pet rabbit that triggered his symptoms.  No significant breathing difficulties are reported since he eliminated culprit allergen.  A recent measurement of his airway inflammation showed a nitric oxide  level of eight parts per billion.     DATA 10/02/2021 eosinophils absolute/allergen panel: EOS abs 0.6 K/uL(600), IgE 286, allergen panel positive across-the-board particularly to dust mites, cat dander, grasses, American cockroach hickory, elm, ragweed.  Other allergens to lesser degree. 11/19/2021 PFTs: FEV1 2.06 L or 64% predicted, FVC 3.39 L or 79% predicted, FEV1/FVC 61%, there is significant bronchodilator response, diffusion capacity normal.  Consistent with airway obstruction, asthmatic type. 02/12/2022 rabbit epithelia IgE: Positive at 7.20 kU/L, class IV, very high. 03/10/2022 CT angio chest: No pulmonary emboli, aortic and coronary artery atherosclerosis, bronchitis with scattered small bronchial mucus impactions in all lobes and mild cylindrical bronchiectasis in the lower lung fields, groundglass opacity anteriorly  in the right lung apex which may suggest pneumonitis. 04/15/2022 CT chest without contrast: Resolved groundglass opacity, resolved bronchial inflammation and peripheral bronchial mucous plugging seen previously.  Review of Systems A 10 point review of systems was performed and it is as noted above otherwise negative.   Patient Active Problem List   Diagnosis Date Noted   Polyp of descending colon 07/28/2023   Polyp of transverse colon 07/28/2023   Dysuria 06/18/2023   Environmental allergies 04/08/2022   Chronic cough 04/08/2022   Tobacco use disorder 03/29/2022   Asthma exacerbation 03/29/2022   Tobacco use disorder, mild, in early remission 03/29/2022   Asthma exacerbation in COPD (HCC) 03/29/2022   Atherosclerosis of aorta 03/17/2022   Severe persistent asthma with acute bronchitis and acute exacerbation (HCC) 03/10/2022   Sinus tachycardia 03/10/2022   Acute hypoxic respiratory failure (HCC) 03/10/2022   Bilateral leg pain 03/10/2022   Moderate to severe persistent allergic asthma 02/12/2022   Kidney stones 09/17/2021   Lumbar back pain 10/05/2019   Left hip pain 10/05/2019   Cigarette smoker 02/24/2018    Social History   Tobacco Use   Smoking status: Former    Current packs/day: 0.00    Average packs/day: 0.5 packs/day for 30.0 years (15.0 ttl pk-yrs)    Types: Cigarettes    Start date: 03/10/1992    Quit date: 03/10/2022    Years since quitting: 2.2   Smokeless tobacco: Never  Substance Use Topics   Alcohol use: Never    Allergies[1]  Active Medications[2]  Immunization History  Administered Date(s) Administered   Influenza,inj,Quad PF,6+ Mos 01/10/2022, 04/04/2023   Tdap 10/29/2017  Objective:     Vitals:   05/27/24 1048  BP: (!) 140/90  Pulse: 79  Height: 5' 7 (1.702 m)  Weight: 188 lb (85.3 kg)  SpO2: 98%  BMI (Calculated): 29.44     SpO2: 98 %  GENERAL: Well-developed, well-nourished gentleman, looks younger than stated age.   Fully ambulatory, no conversational dyspnea. HEAD: Normocephalic, atraumatic.  EYES: Pupils equal, round, reactive to light.  No scleral icterus.  MOUTH: Oral mucosa moist.  No thrush. NECK: Supple. No thyromegaly. Trachea midline. No JVD.  No adenopathy. PULMONARY: Good air entry bilaterally.  No adventitious sounds. CARDIOVASCULAR: S1 and S2. Regular rate and rhythm.  No rubs, murmurs or gallops heard. ABDOMEN: Benign. MUSCULOSKELETAL: No joint deformity, no clubbing, no edema.  NEUROLOGIC: No overt focal deficit, no gait disturbance, speech is fluent. SKIN: Intact,warm,dry. PSYCH: Mood and behavior normal.    Lab Results  Component Value Date   NITRICOXIDE 8 05/27/2024   This result suggests low (<25) Type 2 (T2) airway inflammation indicating a low likelihood of active T2-driven airway inflammation; reduced probability of response to inhaled corticosteroids.        Assessment & Plan:     ICD-10-CM   1. Severe persistent asthma without complication (HCC)  J45.50 Nitric oxide    Allergen trigger: Rabbit bedding and epithelia HP like reaction    2. Former smoker  Z87.891       Orders Placed This Encounter  Procedures   Nitric oxide    Discussion:    Severe persistent asthma without complication Asthma is well-controlled with a nitric oxide  level of 8 parts per billion. He reports reduced use of Breztri  inhaler and good breathing.  He has done better after eliminating exposure to culprit allergen. - Continue current asthma management plan. - Monitor for recurrence of symptoms and resume regular inhaler use if symptoms return. - Scheduled follow-up appointment in six months.     Advised if symptoms do not improve or worsen, to please contact office for sooner follow up or seek emergency care.    I spent 30 minutes of dedicated to the care of this patient on the date of this encounter to include pre-visit review of records, face-to-face time with the patient discussing  conditions above, post visit ordering of testing, clinical documentation with the electronic health record, making appropriate referrals as documented, and communicating necessary findings to members of the patients care team.     C. Leita Sanders, MD Advanced Bronchoscopy PCCM [facility]    *This note was generated using voice recognition software/Dragon and/or AI transcription program.  Despite best efforts to proofread, errors can occur which can change the meaning. Any transcriptional errors that result from this process are unintentional and may not be fully corrected at the time of dictation.     [1]  Allergies Allergen Reactions   Trelegy Ellipta  [Fluticasone -Umeclidin-Vilant] Rash    Resolved after discontinuing Trelegy  [2]  Current Meds  Medication Sig   albuterol  (VENTOLIN  HFA) 108 (90 Base) MCG/ACT inhaler INHALE 1-2 PUFFS BY MOUTH EVERY 6 HOURS AS NEEDED FOR WHEEZE OR SHORTNESS OF BREATH   budesonide -glycopyrrolate-formoterol  (BREZTRI  AEROSPHERE) 160-9-4.8 MCG/ACT AERO inhaler Inhale 2 puffs into the lungs in the morning and at bedtime.   cholecalciferol (VITAMIN D3) 25 MCG (1000 UNIT) tablet Take 1,000 Units by mouth daily.   "

## 2024-05-27 NOTE — Patient Instructions (Signed)
 VISIT SUMMARY:  You are a 64 year old male with asthma who came in for a follow-up appointment. You reported no significant breathing difficulties and are not currently using your Breztri  inhaler. Your recent measurement of airway inflammation showed a nitric oxide  level of eight parts per billion.  YOUR PLAN:  -SEVERE PERSISTENT ASTHMA: Asthma is a condition where your airways narrow and swell, producing extra mucus, which can make breathing difficult. Your asthma is currently well-controlled with a nitric oxide  level of 8 parts per billion. You should continue with your current asthma management plan. Monitor for any recurrence of symptoms and resume regular use of your Breztri  inhaler if symptoms return.  INSTRUCTIONS:  Please schedule a follow-up appointment in six months.

## 2024-05-30 ENCOUNTER — Encounter: Payer: Self-pay | Admitting: Pulmonary Disease

## 2024-06-01 ENCOUNTER — Ambulatory Visit: Payer: Self-pay

## 2024-06-01 ENCOUNTER — Encounter: Payer: Self-pay | Admitting: *Deleted

## 2024-06-01 ENCOUNTER — Ambulatory Visit: Admitting: *Deleted

## 2024-06-01 DIAGNOSIS — F17210 Nicotine dependence, cigarettes, uncomplicated: Secondary | ICD-10-CM

## 2024-06-01 NOTE — Progress Notes (Signed)
 Virtual Visit via Video Note  I connected with Philip [name] on 06/01/24 at  2:00 PM EST by a video enabled telemedicine application and verified that I am speaking with the correct person using two identifiers.  Location: Patient: in home Provider: [address] [place], [place], [address]  Pt told if feeling ill, please call office prior to going to CT scan to speak with a nurse about this.  Decision Making Visit Lung Cancer Screening Program [phone])   Eligibility: Age 64 y.o. Pack Years Smoking History Calculation 45 (# packs/per year x # years smoked) Recent History of coughing up blood  no Unexplained weight loss? no ( >Than 15 pounds within the last 6 months ) Prior History Lung / other cancer no (Diagnosis within the last 5 years already requiring surveillance chest CT Scans). Smoking Status Former Smoker Former Smokers: Years since quit:  NA  Quit Date: NA  Visit Components: Discussion included one or more decision making aids. yes Discussion included risk/benefits of screening. yes Discussion included potential follow up diagnostic testing for abnormal scans. yes Discussion included meaning and risk of over diagnosis. yes Discussion included meaning and risk of False Positives. yes Discussion included meaning of total radiation exposure. yes  Counseling Included: Importance of adherence to annual lung cancer LDCT screening. yes Impact of comorbidities on ability to participate in the program. yes Ability and willingness to under diagnostic treatment. yes  Smoking Cessation Counseling: Current Smokers:  Discussed importance of smoking cessation. yes Information about tobacco cessation classes and interventions provided to patient. yes Patient provided with ticket for LDCT Scan. yes Symptomatic Patient. no  Counseling NA Diagnosis Code: Tobacco Use Z72.0 Asymptomatic Patient yes  Counseling (Intermediate counseling: > three minutes counseling)  G0436\  Counseled patient 4 minutes regarding tobacco use.   Former Smokers:  Discussed the importance of maintaining cigarette abstinence. yes Diagnosis Code: Personal History of Nicotine  Dependence. S12.108 Information about tobacco cessation classes and interventions provided to patient. Yes Patient provided with ticket for LDCT Scan. yes Written Order for Lung Cancer Screening with LDCT placed in Epic. Yes (CT Chest Lung Cancer Screening Low Dose W/O CM) [id number] Z12.2-Screening of respiratory organs Z87.891-Personal history of nicotine  dependence   Josette Ranger, RN 06/01/24

## 2024-06-01 NOTE — Patient Instructions (Signed)

## 2024-06-02 ENCOUNTER — Ambulatory Visit
Admission: RE | Admit: 2024-06-02 | Discharge: 2024-06-02 | Disposition: A | Discharge status: Home / Self Care | Source: Ambulatory Visit | Attending: Acute Care | Admitting: Acute Care

## 2024-06-02 DIAGNOSIS — F17210 Nicotine dependence, cigarettes, uncomplicated: Secondary | ICD-10-CM | POA: Diagnosis present

## 2024-06-02 DIAGNOSIS — Z122 Encounter for screening for malignant neoplasm of respiratory organs: Secondary | ICD-10-CM | POA: Insufficient documentation

## 2024-06-02 DIAGNOSIS — Z87891 Personal history of nicotine dependence: Secondary | ICD-10-CM | POA: Diagnosis present

## 2024-06-06 ENCOUNTER — Encounter: Admitting: Family

## 2024-06-07 ENCOUNTER — Other Ambulatory Visit: Payer: Self-pay

## 2024-06-07 DIAGNOSIS — Z122 Encounter for screening for malignant neoplasm of respiratory organs: Secondary | ICD-10-CM

## 2024-06-07 DIAGNOSIS — Z87891 Personal history of nicotine dependence: Secondary | ICD-10-CM

## 2024-06-07 DIAGNOSIS — F17210 Nicotine dependence, cigarettes, uncomplicated: Secondary | ICD-10-CM

## 2024-06-07 NOTE — Progress Notes (Signed)
 Pt is a current smoke

## 2024-06-08 ENCOUNTER — Telehealth: Payer: Self-pay | Admitting: Family

## 2024-06-09 NOTE — Telephone Encounter (Signed)
 close

## 2024-06-24 ENCOUNTER — Encounter: Admitting: Family
